# Patient Record
Sex: Female | Born: 1935 | Race: White | Hispanic: No | Smoking: Former smoker
Health system: Southern US, Community
[De-identification: ages and names within clinical notes are randomized; demographics above are authoritative.]

## PROBLEM LIST (undated history)

## (undated) DIAGNOSIS — M810 Age-related osteoporosis without current pathological fracture: Secondary | ICD-10-CM

## (undated) DIAGNOSIS — R918 Other nonspecific abnormal finding of lung field: Secondary | ICD-10-CM

## (undated) DIAGNOSIS — R0902 Hypoxemia: Secondary | ICD-10-CM

## (undated) DIAGNOSIS — C3410 Malignant neoplasm of upper lobe, unspecified bronchus or lung: Secondary | ICD-10-CM

## (undated) DIAGNOSIS — J449 Chronic obstructive pulmonary disease, unspecified: Secondary | ICD-10-CM

## (undated) DIAGNOSIS — D649 Anemia, unspecified: Secondary | ICD-10-CM

## (undated) DIAGNOSIS — Z9289 Personal history of other medical treatment: Secondary | ICD-10-CM

## (undated) HISTORY — DX: Age-related osteoporosis without current pathological fracture: M81.0

## (undated) HISTORY — DX: Hypoxemia: R09.02

## (undated) HISTORY — DX: Anemia, unspecified: D64.9

## (undated) HISTORY — DX: Chronic obstructive pulmonary disease, unspecified: J44.9

## (undated) HISTORY — PX: TONSILLECTOMY: SUR1361

## (undated) HISTORY — DX: Other nonspecific abnormal finding of lung field: R91.8

## (undated) HISTORY — PX: CATARACT EXTRACTION W/ INTRAOCULAR LENS  IMPLANT, BILATERAL: SHX1307

---

## 1994-10-20 HISTORY — PX: CHOLECYSTECTOMY: SHX55

## 2014-02-17 DIAGNOSIS — Z9289 Personal history of other medical treatment: Secondary | ICD-10-CM

## 2014-02-17 HISTORY — DX: Personal history of other medical treatment: Z92.89

## 2014-02-28 ENCOUNTER — Ambulatory Visit (INDEPENDENT_AMBULATORY_CARE_PROVIDER_SITE_OTHER): Payer: Medicare Other | Admitting: Family Medicine

## 2014-02-28 ENCOUNTER — Encounter: Payer: Self-pay | Admitting: Family Medicine

## 2014-02-28 ENCOUNTER — Encounter (HOSPITAL_COMMUNITY): Payer: Self-pay | Admitting: Emergency Medicine

## 2014-02-28 ENCOUNTER — Emergency Department (HOSPITAL_COMMUNITY): Payer: Medicare Other

## 2014-02-28 ENCOUNTER — Inpatient Hospital Stay (HOSPITAL_COMMUNITY)
Admission: EM | Admit: 2014-02-28 | Discharge: 2014-03-08 | DRG: 469 | Disposition: A | Discharge status: Skilled Nursing Facility | Payer: Medicare Other | Attending: Internal Medicine | Admitting: Internal Medicine

## 2014-02-28 VITALS — BP 140/90 | HR 80 | Resp 16

## 2014-02-28 DIAGNOSIS — S72001A Fracture of unspecified part of neck of right femur, initial encounter for closed fracture: Secondary | ICD-10-CM | POA: Diagnosis present

## 2014-02-28 DIAGNOSIS — R590 Localized enlarged lymph nodes: Secondary | ICD-10-CM

## 2014-02-28 DIAGNOSIS — D62 Acute posthemorrhagic anemia: Secondary | ICD-10-CM | POA: Diagnosis not present

## 2014-02-28 DIAGNOSIS — Z791 Long term (current) use of non-steroidal anti-inflammatories (NSAID): Secondary | ICD-10-CM

## 2014-02-28 DIAGNOSIS — M25559 Pain in unspecified hip: Secondary | ICD-10-CM

## 2014-02-28 DIAGNOSIS — C3490 Malignant neoplasm of unspecified part of unspecified bronchus or lung: Secondary | ICD-10-CM | POA: Diagnosis present

## 2014-02-28 DIAGNOSIS — M25551 Pain in right hip: Secondary | ICD-10-CM

## 2014-02-28 DIAGNOSIS — I959 Hypotension, unspecified: Secondary | ICD-10-CM

## 2014-02-28 DIAGNOSIS — W19XXXA Unspecified fall, initial encounter: Secondary | ICD-10-CM

## 2014-02-28 DIAGNOSIS — I509 Heart failure, unspecified: Secondary | ICD-10-CM | POA: Diagnosis present

## 2014-02-28 DIAGNOSIS — Z87891 Personal history of nicotine dependence: Secondary | ICD-10-CM

## 2014-02-28 DIAGNOSIS — R339 Retention of urine, unspecified: Secondary | ICD-10-CM | POA: Diagnosis present

## 2014-02-28 DIAGNOSIS — D696 Thrombocytopenia, unspecified: Secondary | ICD-10-CM | POA: Diagnosis not present

## 2014-02-28 DIAGNOSIS — N189 Chronic kidney disease, unspecified: Secondary | ICD-10-CM

## 2014-02-28 DIAGNOSIS — M810 Age-related osteoporosis without current pathological fracture: Secondary | ICD-10-CM | POA: Diagnosis present

## 2014-02-28 DIAGNOSIS — J449 Chronic obstructive pulmonary disease, unspecified: Secondary | ICD-10-CM | POA: Diagnosis present

## 2014-02-28 DIAGNOSIS — R918 Other nonspecific abnormal finding of lung field: Secondary | ICD-10-CM

## 2014-02-28 DIAGNOSIS — J4489 Other specified chronic obstructive pulmonary disease: Secondary | ICD-10-CM | POA: Diagnosis present

## 2014-02-28 DIAGNOSIS — R55 Syncope and collapse: Secondary | ICD-10-CM

## 2014-02-28 DIAGNOSIS — J90 Pleural effusion, not elsewhere classified: Secondary | ICD-10-CM | POA: Diagnosis not present

## 2014-02-28 DIAGNOSIS — N179 Acute kidney failure, unspecified: Secondary | ICD-10-CM | POA: Diagnosis not present

## 2014-02-28 DIAGNOSIS — R338 Other retention of urine: Secondary | ICD-10-CM | POA: Diagnosis not present

## 2014-02-28 DIAGNOSIS — D649 Anemia, unspecified: Secondary | ICD-10-CM | POA: Diagnosis present

## 2014-02-28 DIAGNOSIS — R222 Localized swelling, mass and lump, trunk: Secondary | ICD-10-CM | POA: Diagnosis present

## 2014-02-28 DIAGNOSIS — R7309 Other abnormal glucose: Secondary | ICD-10-CM | POA: Diagnosis present

## 2014-02-28 DIAGNOSIS — N3090 Cystitis, unspecified without hematuria: Secondary | ICD-10-CM | POA: Diagnosis not present

## 2014-02-28 DIAGNOSIS — I5033 Acute on chronic diastolic (congestive) heart failure: Secondary | ICD-10-CM | POA: Diagnosis present

## 2014-02-28 DIAGNOSIS — Z880 Allergy status to penicillin: Secondary | ICD-10-CM

## 2014-02-28 DIAGNOSIS — M84453A Pathological fracture, unspecified femur, initial encounter for fracture: Principal | ICD-10-CM | POA: Diagnosis present

## 2014-02-28 DIAGNOSIS — E860 Dehydration: Secondary | ICD-10-CM | POA: Diagnosis present

## 2014-02-28 LAB — URINE MICROSCOPIC-ADD ON

## 2014-02-28 LAB — URINALYSIS, ROUTINE W REFLEX MICROSCOPIC
Bilirubin Urine: NEGATIVE
Glucose, UA: NEGATIVE mg/dL
Hgb urine dipstick: NEGATIVE
Ketones, ur: 15 mg/dL — AB
NITRITE: NEGATIVE
Specific Gravity, Urine: 1.042 — ABNORMAL HIGH (ref 1.005–1.030)
Urobilinogen, UA: 1 mg/dL (ref 0.0–1.0)
pH: 6.5 (ref 5.0–8.0)

## 2014-02-28 LAB — CBC WITH DIFFERENTIAL/PLATELET
Basophils Absolute: 0 10*3/uL (ref 0.0–0.1)
Basophils Relative: 0 % (ref 0–1)
EOS PCT: 0 % (ref 0–5)
Eosinophils Absolute: 0 10*3/uL (ref 0.0–0.7)
HEMATOCRIT: 41.3 % (ref 36.0–46.0)
HEMOGLOBIN: 13.8 g/dL (ref 12.0–15.0)
Lymphocytes Relative: 5 % — ABNORMAL LOW (ref 12–46)
Lymphs Abs: 0.7 10*3/uL (ref 0.7–4.0)
MCH: 31 pg (ref 26.0–34.0)
MCHC: 33.4 g/dL (ref 30.0–36.0)
MCV: 92.8 fL (ref 78.0–100.0)
MONOS PCT: 3 % (ref 3–12)
Monocytes Absolute: 0.5 10*3/uL (ref 0.1–1.0)
NEUTROS ABS: 13.4 10*3/uL — AB (ref 1.7–7.7)
Neutrophils Relative %: 92 % — ABNORMAL HIGH (ref 43–77)
Platelets: 161 10*3/uL (ref 150–400)
RBC: 4.45 MIL/uL (ref 3.87–5.11)
RDW: 13.9 % (ref 11.5–15.5)
WBC: 14.7 10*3/uL — ABNORMAL HIGH (ref 4.0–10.5)

## 2014-02-28 LAB — COMPREHENSIVE METABOLIC PANEL
ALT: 17 U/L (ref 0–35)
AST: 29 U/L (ref 0–37)
Albumin: 3.1 g/dL — ABNORMAL LOW (ref 3.5–5.2)
Alkaline Phosphatase: 83 U/L (ref 39–117)
BILIRUBIN TOTAL: 0.9 mg/dL (ref 0.3–1.2)
BUN: 20 mg/dL (ref 6–23)
CALCIUM: 9.2 mg/dL (ref 8.4–10.5)
CHLORIDE: 106 meq/L (ref 96–112)
CO2: 24 mEq/L (ref 19–32)
Creatinine, Ser: 0.78 mg/dL (ref 0.50–1.10)
GFR calc Af Amer: 88 mL/min — ABNORMAL LOW (ref >90)
GFR calc non Af Amer: 76 mL/min — ABNORMAL LOW (ref >90)
Glucose, Bld: 135 mg/dL — ABNORMAL HIGH (ref 70–99)
Potassium: 4.5 mEq/L (ref 3.7–5.3)
Sodium: 143 mEq/L (ref 137–147)
Total Protein: 6.3 g/dL (ref 6.0–8.3)

## 2014-02-28 LAB — TROPONIN I: Troponin I: <0.3 ng/mL (ref <0.30)

## 2014-02-28 MED ORDER — ADULT MULTIVITAMIN W/MINERALS CH
1.0000 | ORAL_TABLET | Freq: Every day | ORAL | Status: DC
  Administered 2014-02-28 – 2014-03-08 (×7): 1 via ORAL
  Filled 2014-02-28 (×9): qty 1

## 2014-02-28 MED ORDER — SODIUM CHLORIDE 0.9 % IJ SOLN: 3.0000 mL | INTRAMUSCULAR | Status: DC | PRN

## 2014-02-28 MED ORDER — CEFAZOLIN SODIUM-DEXTROSE 2-3 GM-% IV SOLR
2.0000 g | Freq: Once | INTRAVENOUS | Status: AC
  Administered 2014-02-28: 2 g via INTRAVENOUS
  Filled 2014-02-28: qty 50

## 2014-02-28 MED ORDER — DOCUSATE SODIUM 100 MG PO CAPS
100.0000 mg | ORAL_CAPSULE | Freq: Two times a day (BID) | ORAL | Status: DC | PRN
  Administered 2014-03-04: 100 mg via ORAL
  Filled 2014-02-28: qty 1

## 2014-02-28 MED ORDER — SENNA 8.6 MG PO TABS
1.0000 | ORAL_TABLET | Freq: Every day | ORAL | Status: DC | PRN
  Filled 2014-02-28 (×3): qty 1

## 2014-02-28 MED ORDER — SODIUM CHLORIDE 0.9 % IV SOLN
INTRAVENOUS | Status: AC
  Administered 2014-02-28: 20:00:00 via INTRAVENOUS

## 2014-02-28 MED ORDER — SODIUM CHLORIDE 0.9 % IJ SOLN
3.0000 mL | Freq: Two times a day (BID) | INTRAMUSCULAR | Status: DC
  Administered 2014-02-28: 3 mL via INTRAVENOUS
  Administered 2014-03-02: 11:00:00 via INTRAVENOUS
  Administered 2014-03-04 – 2014-03-07 (×6): 3 mL via INTRAVENOUS

## 2014-02-28 MED ORDER — ONDANSETRON HCL 4 MG/2ML IJ SOLN
4.0000 mg | Freq: Once | INTRAMUSCULAR | Status: AC
  Administered 2014-02-28: 4 mg via INTRAVENOUS
  Filled 2014-02-28: qty 2

## 2014-02-28 MED ORDER — SODIUM CHLORIDE 0.9 % IV BOLUS (SEPSIS)
500.0000 mL | Freq: Once | INTRAVENOUS | Status: AC
  Administered 2014-02-28: 500 mL via INTRAVENOUS

## 2014-02-28 MED ORDER — TRAMADOL HCL 50 MG PO TABS
50.0000 mg | ORAL_TABLET | Freq: Once | ORAL | Status: AC
  Administered 2014-02-28: 50 mg via ORAL
  Filled 2014-02-28: qty 1

## 2014-02-28 MED ORDER — ENOXAPARIN SODIUM 40 MG/0.4ML ~~LOC~~ SOLN
40.0000 mg | SUBCUTANEOUS | Status: DC
  Administered 2014-02-28: 40 mg via SUBCUTANEOUS
  Filled 2014-02-28: qty 0.4

## 2014-02-28 MED ORDER — FENTANYL CITRATE 0.05 MG/ML IJ SOLN
50.0000 ug | Freq: Once | INTRAMUSCULAR | Status: AC
  Administered 2014-02-28: 50 ug via INTRAVENOUS
  Filled 2014-02-28: qty 2

## 2014-02-28 MED ORDER — HYDROCODONE-ACETAMINOPHEN 5-325 MG PO TABS
1.0000 | ORAL_TABLET | ORAL | Status: DC | PRN
  Administered 2014-02-28 – 2014-03-01 (×2): 1 via ORAL
  Administered 2014-03-02 – 2014-03-06 (×4): 2 via ORAL
  Filled 2014-02-28: qty 2
  Filled 2014-02-28: qty 1
  Filled 2014-02-28 (×5): qty 2

## 2014-02-28 MED ORDER — SODIUM CHLORIDE 0.9 % IJ SOLN
3.0000 mL | Freq: Two times a day (BID) | INTRAMUSCULAR | Status: DC
  Administered 2014-02-28 – 2014-03-08 (×3): 3 mL via INTRAVENOUS

## 2014-02-28 MED ORDER — PROMETHAZINE HCL 25 MG PO TABS: 12.5000 mg | ORAL_TABLET | ORAL | Status: DC | PRN

## 2014-02-28 NOTE — Consult Note (Signed)
Reason for Consult:hip fracture  Referring Physician: hospitalist  Michele Johns is an 80 y.o. female.  HPI: Pt with several weeks of right hip pain.  No injury.  Reports pain has been radiating into the right groin causing her to limp.  On Sunday the pain became very severe and she could not tolerate weight bearing. She decided she would get it checked on Monday.  Pt presented to the Urgent Care where she had a syncopal episode and was transferred to the ED for evaluation.  Radiographs show nondisplaced femoral neck fracture.  Pt very good health and lives alone.  Independent with all activities.  She still drives and does her own grocery shopping.  No primary care doctor.  Takes only vitamins.  Previous surgery for cholecystectomy.   Daughter present during interview.  Family close and will help care for her.  Discussed pinning of hip versus anterior total hip replacement.  Pt wishes to remain as independent as possible but will consider short term facility for rehab.  Risk and benefits of both procedures discussed with pt an daughter.  They will discuss options and make final decision with Dr Yates when he comes later today.    Pt will be admitted by the Hospitalist service and cleared for surgery to be done by Dr Yates possibly tomorrow.  History reviewed. No pertinent past medical history.  History reviewed. No pertinent past surgical history.  No family history on file.  Social History:  reports that she has never smoked. She does not have any smokeless tobacco history on file. She reports that she does not drink alcohol or use illicit drugs.  Allergies:  Allergies  Allergen Reactions  . Penicillins Other (See Comments)    Reaction: blisters on leg    Medications: I have reviewed the patient's current medications.  Results for orders placed during the hospital encounter of 02/28/14 (from the past 48 hour(s))  CBC WITH DIFFERENTIAL     Status: Abnormal   Collection Time     05 /12/15  1:19 PM      Result Value Ref Range   WBC 14.7 (*) 4.0 - 10.5 K/uL   RBC 4.45  3.87 - 5.11 MIL/uL   Hemoglobin 13.8  12.0 - 15.0 g/dL   HCT 41.3  36.0 - 46.0 %   MCV 92.8  78.0 - 100.0 fL   MCH 31.0  26.0 - 34.0 pg   MCHC 33.4  30.0 - 36.0 g/dL   RDW 13.9  11.5 - 15.5 %   Platelets 161  150 - 400 K/uL   Neutrophils Relative % 92 (*) 43 - 77 %   Neutro Abs 13.4 (*) 1.7 - 7.7 K/uL   Lymphocytes Relative 5 (*) 12 - 46 %   Lymphs Abs 0.7  0.7 - 4.0 K/uL   Monocytes Relative 3  3 - 12 %   Monocytes Absolute 0.5  0.1 - 1.0 K/uL   Eosinophils Relative 0  0 - 5 %   Eosinophils Absolute 0.0  0.0 - 0.7 K/uL   Basophils Relative 0  0 - 1 %   Basophils Absolute 0.0  0.0 - 0.1 K/uL  COMPREHENSIVE METABOLIC PANEL     Status: Abnormal   Collection Time    02/28/14  1:19 PM      Result Value Ref Range   Sodium 143  137 - 147 mEq/L   Potassium 4.5  3.7 - 5.3 mEq/L   Chloride 106  96 - 112 mEq/L   CO2  24  19 - 32 mEq/L   Glucose, Bld 135 (*) 70 - 99 mg/dL   BUN 20  6 - 23 mg/dL   Creatinine, Ser 0.78  0.50 - 1.10 mg/dL   Calcium 9.2  8.4 - 10.5 mg/dL   Total Protein 6.3  6.0 - 8.3 g/dL   Albumin 3.1 (*) 3.5 - 5.2 g/dL   AST 29  0 - 37 U/L   ALT 17  0 - 35 U/L   Alkaline Phosphatase 83  39 - 117 U/L   Total Bilirubin 0.9  0.3 - 1.2 mg/dL   GFR calc non Af Amer 76 (*) >90 mL/min   GFR calc Af Amer 88 (*) >90 mL/min   Comment: (NOTE)     The eGFR has been calculated using the CKD EPI equation.     This calculation has not been validated in all clinical situations.     eGFR's persistently <90 mL/min signify possible Chronic Kidney     Disease.  TROPONIN I     Status: None   Collection Time    02/28/14  1:19 PM      Result Value Ref Range   Troponin I <0.30  <0.30 ng/mL   Comment:            Due to the release kinetics of cTnI,     a negative result within the first hours     of the onset of symptoms does not rule out     myocardial infarction with certainty.     If  myocardial infarction is still suspected,     repeat the test at appropriate intervals.  URINALYSIS, ROUTINE W REFLEX MICROSCOPIC     Status: Abnormal   Collection Time    02/28/14  2:01 PM      Result Value Ref Range   Color, Urine AMBER (*) YELLOW   Comment: BIOCHEMICALS MAY BE AFFECTED BY COLOR   APPearance HAZY (*) CLEAR   Specific Gravity, Urine 1.042 (*) 1.005 - 1.030   pH 6.5  5.0 - 8.0   Glucose, UA NEGATIVE  NEGATIVE mg/dL   Hgb urine dipstick NEGATIVE  NEGATIVE   Bilirubin Urine NEGATIVE  NEGATIVE   Ketones, ur 15 (*) NEGATIVE mg/dL   Protein, ur >300 (*) NEGATIVE mg/dL   Urobilinogen, UA 1.0  0.0 - 1.0 mg/dL   Nitrite NEGATIVE  NEGATIVE   Leukocytes, UA SMALL (*) NEGATIVE  URINE MICROSCOPIC-ADD ON     Status: Abnormal   Collection Time    02/28/14  2:01 PM      Result Value Ref Range   Squamous Epithelial / LPF FEW (*) RARE   WBC, UA 7-10  <3 WBC/hpf   Bacteria, UA MANY (*) RARE   Casts HYALINE CASTS (*) NEGATIVE   Comment: GRANULAR CAST   Crystals CA OXALATE CRYSTALS (*) NEGATIVE   Urine-Other LESS THAN 10 mL OF URINE SUBMITTED      Dg Chest 2 View  02/28/2014   CLINICAL DATA:  Syncope  EXAM: CHEST  2 VIEW  COMPARISON:  None.  FINDINGS: The cardiac and mediastinal silhouettes are within normal limits. Atherosclerotic calcifications noted within the aortic arch.  The lungs are mildly hyperinflated with attenuation of the pulmonary markings, compatible with COPD. Marland Kitchen No airspace consolidation, pleural effusion, or pulmonary edema is identified. There is no pneumothorax.  No acute osseous abnormality identified.  IMPRESSION: 1. No acute cardiopulmonary abnormality. 2. COPD.   Electronically Signed   By: Jeannine Boga  M.D.   On: 02/28/2014 14:23   Dg Hip Complete Right  02/28/2014   CLINICAL DATA:  Right anterior groin pain radiating distally. Limited range of motion.  EXAM: RIGHT HIP - COMPLETE 2+ VIEW  COMPARISON:  None available.  FINDINGS: There is an acute  nondisplaced fracture through the right femoral neck with slight superior subluxation. The femoral head remains normally aligned within the acetabulum. Femoral head height is preserved. Limited views of the left hip are unremarkable. No pubic dye stasis. SI joints are approximated. Bony pelvis is intact.  Diffuse osteopenia noted. Degenerative changes present within the lower lumbar spine.  IMPRESSION: Acute nondisplaced fracture through the right femoral neck.   Electronically Signed   By: Jeannine Boga M.D.   On: 02/28/2014 14:25    Review of Systems  Constitutional: Negative.   HENT: Negative.   Eyes: Negative.   Respiratory: Negative.  Negative for shortness of breath.   Cardiovascular: Negative.  Negative for chest pain and palpitations.  Gastrointestinal: Negative.   Genitourinary: Negative.   Musculoskeletal:       Severe right groin pain  Psychiatric/Behavioral: Negative.    Blood pressure 143/55, pulse 78, temperature 97.5 F (36.4 C), temperature source Oral, resp. rate 18, weight 45.36 kg (100 lb), SpO2 98.00%. Physical Exam  Constitutional: She is oriented to person, place, and time. She appears well-developed and well-nourished.  HENT:  Head: Normocephalic and atraumatic.  Eyes: Pupils are equal, round, and reactive to light.  Neck: Normal range of motion.  GI: Soft.  Musculoskeletal:  No leg length discrepancy.  Distal pulsed +1 bilateral.  No peripheral edema.  Does not tolerate motion of the right leg.  Ankle DF/PF intact.  Sensation intact distally.  Neurological: She is alert and oriented to person, place, and time.  Skin: Skin is warm and dry.  Psychiatric: She has a normal mood and affect.    Assessment/Plan: Right femoral neck fracture.  Discussed options of pinning vs anterior THR.  Final decision will by made when Dr Lorin Mercy sees pt later today.  Epimenio Foot 02/28/2014, 4:49 PM

## 2014-02-28 NOTE — ED Provider Notes (Signed)
CSN: [id number]     Arrival date & time    History   First MD Initiated Contact with Patient 02/28/14 1243     Chief Complaint  Patient presents with  . Hip Pain  . Loss of Consciousness     (Consider location/radiation/quality/duration/timing/severity/associated sxs/prior Treatment) Patient is a 80 y.o. female presenting with hip pain and syncope. The history is provided by the patient.  Hip Pain This is a new problem. The current episode started more than 1 week ago. The problem occurs constantly. The problem has been rapidly worsening. Pertinent negatives include no chest pain, no abdominal pain, no headaches and no shortness of breath. The symptoms are aggravated by walking. Nothing relieves the symptoms. She has tried acetaminophen for the symptoms. The treatment provided mild relief.  Loss of Consciousness Episode history:  Single Most recent episode:  Today Timing: once. Progression:  Resolved Chronicity:  New Context comment:  While walking Witnessed: yes   Relieved by:  Nothing Worsened by:  Nothing tried Ineffective treatments:  None tried Associated symptoms: no chest pain, no dizziness, no fever, no headaches, no nausea, no shortness of breath and no vomiting     History reviewed. No pertinent past medical history. History reviewed. No pertinent past surgical history. No family history on file. History  Substance Use Topics  . Smoking status: Never Smoker   . Smokeless tobacco: Not on file  . Alcohol Use: No   OB History   Grav Para Term Preterm Abortions TAB SAB Ect Mult Living                 Review of Systems  Constitutional: Negative for fever and fatigue.  HENT: Negative for congestion and drooling.   Eyes: Negative for pain.  Respiratory: Negative for cough and shortness of breath.   Cardiovascular: Positive for syncope. Negative for chest pain.  Gastrointestinal: Negative for nausea, vomiting, abdominal pain and diarrhea.  Genitourinary: Negative  for dysuria and hematuria.  Musculoskeletal: Negative for back pain, gait problem and neck pain.  Skin: Negative for color change.  Neurological: Negative for dizziness and headaches.  Hematological: Negative for adenopathy.  Psychiatric/Behavioral: Negative for behavioral problems.  All other systems reviewed and are negative.     Allergies  Penicillins  Home Medications   Prior to Admission medications   Medication Sig Start Date End Date Taking? Authorizing Provider  Multiple Vitamin (MULTIVITAMIN WITH MINERALS) TABS tablet Take 1 tablet by mouth daily.   Yes Historical Provider, MD  naproxen sodium (ANAPROX) 220 MG tablet Take 440 mg by mouth daily.   Yes Historical Provider, MD   BP 139/54  Temp(Src) 97.5 F (36.4 C) (Oral)  Resp 18  Wt 100 lb (45.36 kg)  SpO2 91% Physical Exam  Nursing note and vitals reviewed. Constitutional: She is oriented to person, place, and time. She appears well-developed and well-nourished.  HENT:  Head: Normocephalic.  Mouth/Throat: Oropharynx is clear and moist. No oropharyngeal exudate.  Eyes: Conjunctivae and EOM are normal. Pupils are equal, round, and reactive to light.  Neck: Normal range of motion. Neck supple.  Cardiovascular: Normal rate, regular rhythm, normal heart sounds and intact distal pulses.  Exam reveals no gallop and no friction rub.   No murmur heard. Pulmonary/Chest: Effort normal and breath sounds normal. No respiratory distress. She has no wheezes.  Abdominal: Soft. Bowel sounds are normal. There is no tenderness. There is no rebound and no guarding.  Musculoskeletal: Normal range of motion. She exhibits no edema and  no tenderness.  2+ distal pulses in the lower extremities.  Normal range of motion of the left hip without pain.  The patient has pain with extension and flexion of the right hip.  Otherwise normal appearing and symmetric lower extremities.  Neurological: She is alert and oriented to person, place, and  time.  Skin: Skin is warm and dry.  Psychiatric: She has a normal mood and affect. Her behavior is normal.    ED Course  Procedures (including critical care time) Labs Review Labs Reviewed  CBC WITH DIFFERENTIAL - Abnormal; Notable for the following:    WBC 14.7 (*)    Neutrophils Relative % 92 (*)    Neutro Abs 13.4 (*)    Lymphocytes Relative 5 (*)    All other components within normal limits  COMPREHENSIVE METABOLIC PANEL - Abnormal; Notable for the following:    Glucose, Bld 135 (*)    Albumin 3.1 (*)    GFR calc non Af Amer 76 (*)    GFR calc Af Amer 88 (*)    All other components within normal limits  URINALYSIS, ROUTINE W REFLEX MICROSCOPIC - Abnormal; Notable for the following:    Color, Urine AMBER (*)    APPearance HAZY (*)    Specific Gravity, Urine 1.042 (*)    Ketones, ur 15 (*)    Protein, ur >300 (*)    Leukocytes, UA SMALL (*)    All other components within normal limits  URINE MICROSCOPIC-ADD ON - Abnormal; Notable for the following:    Squamous Epithelial / LPF FEW (*)    Bacteria, UA MANY (*)    Casts HYALINE CASTS (*)    Crystals CA OXALATE CRYSTALS (*)    All other components within normal limits  URINE CULTURE  TROPONIN I  VITAMIN D 25 HYDROXY  CBC WITH DIFFERENTIAL  TROPONIN I  TROPONIN I  HEMOGLOBIN A1C    Imaging Review Dg Chest 2 View  02/28/2014   CLINICAL DATA:  Syncope  EXAM: CHEST  2 VIEW  COMPARISON:  None.  FINDINGS: The cardiac and mediastinal silhouettes are within normal limits. Atherosclerotic calcifications noted within the aortic arch.  The lungs are mildly hyperinflated with attenuation of the pulmonary markings, compatible with COPD. Marland Kitchen No airspace consolidation, pleural effusion, or pulmonary edema is identified. There is no pneumothorax.  No acute osseous abnormality identified.  IMPRESSION: 1. No acute cardiopulmonary abnormality. 2. COPD.   Electronically Signed   By: Jeannine Boga M.D.   On: 02/28/2014 14:23   Dg Hip  Complete Right  02/28/2014   CLINICAL DATA:  Right anterior groin pain radiating distally. Limited range of motion.  EXAM: RIGHT HIP - COMPLETE 2+ VIEW  COMPARISON:  None available.  FINDINGS: There is an acute nondisplaced fracture through the right femoral neck with slight superior subluxation. The femoral head remains normally aligned within the acetabulum. Femoral head height is preserved. Limited views of the left hip are unremarkable. No pubic dye stasis. SI joints are approximated. Bony pelvis is intact.  Diffuse osteopenia noted. Degenerative changes present within the lower lumbar spine.  IMPRESSION: Acute nondisplaced fracture through the right femoral neck.   Electronically Signed   By: Jeannine Boga M.D.   On: 02/28/2014 14:25     EKG Interpretation   Date/Time:  Tuesday Feb 28 2014 12:48:43 EDT Ventricular Rate:  94 PR Interval:  152 QRS Duration: 78 QT Interval:  402 QTC Calculation: 503 R Axis:   -76 Text Interpretation:  Age  not entered, assumed to be  80 years old for  purpose of ECG interpretation Sinus rhythm Right atrial enlargement Left  anterior fascicular block Abnormal R-wave progression, early transition  Nonspecific T abnrm, anterolateral leads Prolonged QT interval Confirmed  by Karyna Bessler  MD, Jenise Iannelli ([id number]) on 02/28/2014 2:45:44 PM      MDM   Final diagnoses:  Closed right hip fracture  Syncope    1:19 PM 80 y.o. female who presents with worsening right hip pain over the last few weeks. She notes acute worsening in last 2 days. She was walking into an urgent care this morning when she had a syncopal episode. She believes she was caught by her daughter and did not fall to the ground. She was having right hip pain at that time. She did not describe any prodrome. Initial blood pressure here was 80/40 but repeat blood pressures have been normal and I suspect this to be a spurious value. She is afebrile and vital signs are unremarkable on my exam. She denies  any fevers or other associated symptoms at home. Will get screening labwork and imaging.  Discussed case w/ Dr. Lorin Mercy (ortho). Will admit to medicine teaching service (unassigned)  Blanchard Kelch, MD 02/28/14 2010

## 2014-02-28 NOTE — Progress Notes (Signed)
   Subjective:  This chart was scribed for Robyn Haber, MD  by Stacy Gardner, [facility] Scribe. The patient was seen in room and the patient's care was started at 12:09 PM.  Patient ID: Michele Johns, female    DOB: 1930-01-14, 80 y.o.   MRN: [id number]  02/28/2014  Hip Pain, Cyst and Leg Pain   HPI HPI Comments: Michele Johns is a 80 y.o. female who [facility] complaining of right hip pain, probable swelling, and leg pain.   The patient states that she had pain in her right hip which was manageable relief over the last 7 days. Yesterday the pain got significantly worse. Daughter brought her mother to the office today where her mother collapsed in the parking lot. She was lifted to a wheelchair and was unresponsive for about 3 minutes. During that time she was moved to a bed and she became more responsive.  Patient had no chest pain, shortness of breath, abdominal pain, nausea vomiting or diarrhea. She has no history of syncope.   Review of Systems  No past medical history on file. Allergies not on file No current outpatient prescriptions on file.   No current facility-administered medications for this visit.       Objective:    BP 80/40  Pulse 80  Resp 16 Physical Exam No results found for this or any previous visit. As noted above, patient was unresponsive when I first examined her in the parking lot. She was quickly move be a wheelchair onto a bed at which time she awoke and became responsive again. She remembers passing out in the parking lot but does not remember anything until she was moved to the gurney on exam room #6.  Patient clearly has pain in her right hip whenever removed. Her blood pressure rapidly returned to normal and she was put on oxygen. An IV was attempted but the vein blew by the time the EMTs arrived.  Patient was alert and cooperative. She is moving her upper extremities normally and her left leg  as well. Her right hip was tender on palpation of the trochanter and she experienced significant pain with any movement of the right hip.  Chest is clear, neck is supple, heart is regular without murmur, blood pressure did come up to 140/90 by the time the EMT people arrived. Abdomen is soft and nontender Ribs are nontender and neck is nontender. There is no evidence for head trauma. Her extremities are without edema and she has good pedal pulses    Assessment & Plan:  , I suspect patient has suffered a fractured hip and right femoral neck although this needs to be verified with radiographic imaging.  The presyncopal or episode does not appear to be a cardiovascular event but instead made for resulted from extreme pain when patient was trying to get out of her car.  At any rate, patient needs imaging of the right hip and further observation.  Signed, Robyn Haber, MD

## 2014-02-28 NOTE — ED Notes (Signed)
Admitting Physicians at the bedside.

## 2014-02-28 NOTE — Progress Notes (Signed)
Patient ID: Michele Johns, female   DOB: 1930-03-19, 80 y.o.   MRN: [id number] Discussed with pt and family surgical   Options patient wants to proceed with THA since she would be limited with TDWB with  Hip pinning.  Consent ordered. Patient likely had a stress fracture of the femoral neck  Which displaced as she was getting out of car to go to the urgent care .  Was not able to walk and xrays showed femoral neck fracture.  Plan right THA tomorrow afternoon.   Clear liquid breakfast then NPO

## 2014-02-28 NOTE — H&P (Signed)
Date: 02/28/2014               Patient Name:  Michele Johns MRN: [id number]  DOB: 08-18-1930 Age / Sex: 80 y.o., female   PCP: No primary provider on file.         Medical Service: Internal Medicine Teaching Service         Attending Physician: Dr. Bartholomew Crews, MD    First Contact: Dr. Juluis Mire  Pager: [phone]  Second Contact: Dr. Karlyn Agee Pager: [phone]       After Hours (After 5p/  First Contact Pager: [phone]  weekends / holidays): Second Contact Pager: [phone]   Chief Complaint: Syncope and progressive R Hip pain and R leg pain  History of Present Illness: Michele Johns is an 80 yo female with an unremarkable history who presented to the ED after a syncopal episode at the local [facility]. She had just arrived at the urgent care due to progressive pain on her right hip which was manageable the previous 7 days until Sunday, May 10th, when her pain became significantly worse. When she arrived at the Urgent care and got out of the car, she told her daughter that she "felt weak and needed help sitting down".  Her daughter, Michele Johns, reports that she was unconscious for less than 3 minutes before waking up without any confusion or lapse of memory before the episode. She remembers passing out in the parking lot but did not have any recollection of what happened between then and waking up inside of the exam room in the urgent care. While unconscious, her vitals were measured by the Urgent care employees and read at BP 80/40 , HR 80 and RR 16. An IV was attempted but the vein blew by the time the EMTs arrived. Patient denies any previous syncope, hypoglycemic episodes, weakness, altered mental status, SOB, or chest pain.  In regards to the hip and leg pain, the patient reports excruciating pain in any attempts of movement of her right leg. She has had this constant pain on her right upper leg for 2 weeks but has been getting progressively worse. Her pain was  alleviated by Alleve at the first but became progressively less responsive.  She localizes the pain on the lateral right hip and does not believe that she is currently weight baring. Her pain radiates down her leg when movement is attempted but was unwilling to move her leg during exam. Patient denies any trauma, weight loss, night sweats, fever, chills, dysuria, or urinary frequency.  Meds: No current facility-administered medications for this encounter.   Current Outpatient Prescriptions  Medication Sig Dispense Refill  . Multiple Vitamin (MULTIVITAMIN WITH MINERALS) TABS tablet Take 1 tablet by mouth daily.      . naproxen sodium (ANAPROX) 220 MG tablet Take 440 mg by mouth daily.        Allergies: Allergies as of 02/28/2014 - Review Complete 02/28/2014  Allergen Reaction Noted  . Penicillins Other (See Comments) 02/28/2014   History reviewed. No pertinent past medical history. History reviewed. No pertinent past surgical history. No family history on file. History   Social History  . Marital Status: Married    Spouse Name: N/A    Number of Children: N/A  . Years of Education: N/A   Occupational History  . Not on file.   Social History Main Topics  . Smoking status: Never Smoker   . Smokeless tobacco: Not on file  . Alcohol Use:  No  . Drug Use: No  . Sexual Activity: Not on file   Other Topics Concern  . Not on file   Social History Narrative  . No narrative on file    Review of Systems: Pertinent items are noted in HPI.  Physical Exam: Blood pressure 143/55, pulse 78, temperature 97.5 F (36.4 C), temperature source Oral, resp. rate 18, weight 45.36 kg (100 lb), SpO2 98.00%. BP 143/55  Pulse 78  Temp(Src) 97.5 F (36.4 C) (Oral)  Resp 18  Wt 45.36 kg (100 lb)  SpO2 98%  General Appearance:    Alert, cooperative, no distress, appears stated age  Head:    Normocephalic, without obvious abnormality, atraumatic  Eyes:    PERRL, conjunctiva/corneas clear,  EOM's intact  Nose:   Nares normal, septum midline, mucosa normal, no drainage    or sinus tenderness  Throat:   Lips, mucosa, and tongue normal; teeth and gums normal  Neck:   Supple, symmetrical, trachea midline, no adenopathy;    thyroid:  no enlargement/tenderness/nodules; no carotid   bruit or JVD  Lungs:     Clear to auscultation bilaterally, respirations unlabored. Decreased breath sounds  Chest Wall:    No tenderness or deformity   Heart:    Regular rate and rhythm, S1 and S2 normal, no murmur, rub   or gallop  Abdomen:     Soft, non-tender, bowel sounds active all four quadrants,    no masses, no organomegaly  Musculoskeletal:   Tenderness to light palpation on R hip and upper R leg. Unable to assess movement due to pain elicited on R leg.  Pulses:   2+ and symmetric all extremities  Skin:   Skin color, texture, turgor normal, no rashes or lesions  Lymph nodes:   Cervical, supraclavicular, and axillary nodes normal  Neurologic:   CNII-XII intact, normal strength, sensation and reflexes    throughout    Lab results:  CBC    Component Value Date/Time   WBC 14.7* 02/28/2014 1319   RBC 4.45 02/28/2014 1319   HGB 13.8 02/28/2014 1319   HCT 41.3 02/28/2014 1319   PLT 161 02/28/2014 1319   MCV 92.8 02/28/2014 1319   MCH 31.0 02/28/2014 1319   MCHC 33.4 02/28/2014 1319   RDW 13.9 02/28/2014 1319   LYMPHSABS 0.7 02/28/2014 1319   MONOABS 0.5 02/28/2014 1319   EOSABS 0.0 02/28/2014 1319   BASOSABS 0.0 02/28/2014 1319   Troponin (Point of Care Test) Cardiac Panel (last 3 results)  Recent Labs  02/28/14 1319  TROPONINI <4.16   basic metabolic panel Results for LASHANDRA, ARAUZ (MRN [id number]) as of 02/28/2014 17:25  Ref. Range 02/28/2014 12:48 02/28/2014 12:48 02/28/2014 13:19 02/28/2014 14:01 02/28/2014 14:15  Sodium Latest Range: 137-147 mEq/L   143    Potassium Latest Range: 3.7-5.3 mEq/L   4.5    Chloride Latest Range: 96-112 mEq/L   106    CO2 Latest Range: 19-32 mEq/L   24    BUN  Latest Range: 6-23 mg/dL   20    Creatinine Latest Range: 0.50-1.10 mg/dL   0.78    Calcium Latest Range: 8.4-10.5 mg/dL   9.2    GFR calc non Af Amer Latest Range: >90 mL/min   76 (L)    GFR calc Af Amer Latest Range: >90 mL/min   88 (L)    Glucose Latest Range: 70-99 mg/dL   135 (H)    Alkaline Phosphatase Latest Range: 39-117 U/L   83  Albumin Latest Range: 3.5-5.2 g/dL   3.1 (L)    AST Latest Range: 0-37 U/L   29    ALT Latest Range: 0-35 U/L   17    Total Protein Latest Range: 6.0-8.3 g/dL   6.3    Total Bilirubin Latest Range: 0.3-1.2 mg/dL   0.9      Urinalysis    Component Value Date/Time   COLORURINE AMBER* 02/28/2014 1401   APPEARANCEUR HAZY* 02/28/2014 1401   LABSPEC 1.042* 02/28/2014 1401   PHURINE 6.5 02/28/2014 1401   GLUCOSEU NEGATIVE 02/28/2014 1401   HGBUR NEGATIVE 02/28/2014 1401   BILIRUBINUR NEGATIVE 02/28/2014 1401   KETONESUR 15* 02/28/2014 1401   PROTEINUR >300* 02/28/2014 1401   UROBILINOGEN 1.0 02/28/2014 1401   NITRITE NEGATIVE 02/28/2014 1401   LEUKOCYTESUR SMALL* 02/28/2014 1401       Imaging results:  Dg Chest 2 View  02/28/2014   CLINICAL DATA:  Syncope  EXAM: CHEST  2 VIEW  COMPARISON:  None.  FINDINGS: The cardiac and mediastinal silhouettes are within normal limits. Atherosclerotic calcifications noted within the aortic arch.  The lungs are mildly hyperinflated with attenuation of the pulmonary markings, compatible with COPD. Marland Kitchen No airspace consolidation, pleural effusion, or pulmonary edema is identified. There is no pneumothorax.  No acute osseous abnormality identified.  IMPRESSION: 1. No acute cardiopulmonary abnormality. 2. COPD.   Electronically Signed   By: Jeannine Boga M.D.   On: 02/28/2014 14:23   Dg Hip Complete Right  02/28/2014   CLINICAL DATA:  Right anterior groin pain radiating distally. Limited range of motion.  EXAM: RIGHT HIP - COMPLETE 2+ VIEW  COMPARISON:  None available.  FINDINGS: There is an acute nondisplaced fracture  through the right femoral neck with slight superior subluxation. The femoral head remains normally aligned within the acetabulum. Femoral head height is preserved. Limited views of the left hip are unremarkable. No pubic dye stasis. SI joints are approximated. Bony pelvis is intact.  Diffuse osteopenia noted. Degenerative changes present within the lower lumbar spine.  IMPRESSION: Acute nondisplaced fracture through the right femoral neck.   Electronically Signed   By: Jeannine Boga M.D.   On: 02/28/2014 14:25    Other results: EKG: normal sinus rhythm, prolonged QT interval, Right trial enlargement, left anterior fascicular block, abnormal R wave progression, early transition, nonspecific T abnormal, anterolateral leads, prolonged QT interval..  Assessment & Plan by Problem: Mrs. Hibner is an 80 yo female with an unremarkable history who presented to the ED after a syncopal episode 2/2 to acute nondisplaced fracture through the right femoral neck. Probable causes of syncope in this patient include orthostatic hypotension, vasovagal episode, heart block, acute MI, infection, hypovolemia and seizures with vasovagal and orthostatic hypotension being the most probable cause. Cardiac etiology unllikely given no SOB/CP/palpitations, negative troponin, and no ischemic changes on 12-lead EKG. No neurological deficits, seizure activity, or post-ictal activity.  No new medications or electrolytes  abnormalities.   # Acute nondisplaced fracture through the right femoral neck - On XR right hip on 5/12. Pt with 2 week history of right hip/leg pain with no falls, injuries, or trauma. Pt with no prior BMD and calcium levels normal. Etiology most likely due to undiagnosed osteoporosis due to advanced age (80 yo F).  - Orthopedics consulted. Verbal confirmation for ORIF tomorrow . - Follow orthopedic recommendation - Norco/vicodin 1-2 Q 4hr pain  -Obtain Vit 25 OH levels  -Consider calcium/vitmain D  supplementation  -PT/OT once able to tolerate  -  Consider SNF   # Syncope - Syncopal episode most likely caused by the pain from her fracture - Once able to ambulate, will check orthostatics - Cycle troponins  -Monitor on telemetry  -Frequent neuro checks  -NS 75 mL/hr   # Osteopenia - Follow up with DEXA scan in the outpatient setting  # Asymptomatic cystitis - Pt with UA on 5/12 with small LE, pyuria (7-10), and bacteruria (many). Also with calcium oxalate crystals. WBC elevated at 14.7K with neutrophilia (92%).  -Monitor for symptoms  -No antibiotic therapy indicated at this time  -F/U urinary cultures  # Hyperglycemia - Glucose of 135 on CMP with no history of DM -Obtain A1c  # DVT prophylaxis - Loxenox 40mg  daily  Code: Full  DVT Ppx: Lovenox daily  Diet: Regular, NPO after midnight   Dispo: Disposition is deferred at this time, awaiting improvement of current medical problems. Anticipated discharge in approximately 5-7 day(s).   The patient does not have a current PCP (No primary provider on file.) and does need an [facility] hospital follow-up appointment after discharge.  The patient does have transportation limitations that hinder transportation to clinic appointments.  Signed: Judithe Modest, Med Student 02/28/2014, 4:41 PM

## 2014-02-28 NOTE — ED Notes (Signed)
Provider at the bedside.  

## 2014-02-28 NOTE — ED Notes (Signed)
Per EMS- Pt was at [facility] c/o right hip pain for 1 week; today when she was getting out of car at [facility] stood up and felt a pop, then had syncopal episode. Was taken inside [facility], doctors reports pt had agonal respirations, BP 80/60. Pt came around. Was given 100 mcg fentanyl, 4 mg zofran. Is currently diaphoretic, BP 142/71, 92% RA, placed on 2 L 98%. CBG 132. EKG SR. Pt is a x 4.

## 2014-02-28 NOTE — H&P (Signed)
Date: 02/28/2014               Patient Name:  Michele Johns MRN: [id number]  DOB: Sep 27, 1930 Age / Sex: 80 y.o., female   PCP: No primary provider on file.         Medical Service: Internal Medicine Teaching Service         Attending Physician: Dr. Bartholomew Crews, MD    First Contact: Dr. Naaman Plummer Pager: [phone]  Second Contact: Dr. Aundra Dubin Pager: [phone]       After Hours (After 5p/  First Contact Pager: [phone]  weekends / holidays): Second Contact Pager: [phone]   Chief Complaint: right leg/hip pain and LOC  History of Present Illness:   Michele Johns is a 80 year old female with no significant past medical history who presents with right leg/hip pain and episode of LOC that occurred today.   Pt reports that she was in her normal state of health until 2 weeks ago when she began having right leg/hip pain with no falls, injuries, or trauma that was at first relieved with Alieve but later worsened 2 days ago on Mother's Day.  She was able to walk at that time. Pt went to [facility] today around noon where she all of a sudden started feeling weak and then passed out while walking to the facility. She does not remember the exact details, but per daughter was out for about 3 minutes with no reported head injury, seizure activity, tongue biting, or bowel/urinary incontinence. She then awakened without confusion but found to be in significant pain with hypotension (low 80/60) that improved shortly thereafter with rest and oxygen therapy (92% on RA, requiring 2 L O2). She reports she was having good PO intake at home with normal urine output (without urinary symptoms). She denies lightheadedness, dizziness, chest pain, dyspnea, palpitations, focal weakness, paraesthesias, HA, or vision change.   Pt has no prior history of falls, syncope, DVT, or fractures. She has never had BMD scan before and takes multivitamins.   On ED admission pt had BP of 139/54 with normal SpO2 on RA.  Currently she reports persistent right hip pain and unable to move her right leg, stand, or walk.    Meds: No current facility-administered medications for this encounter.   Current Outpatient Prescriptions  Medication Sig Dispense Refill  . Multiple Vitamin (MULTIVITAMIN WITH MINERALS) TABS tablet Take 1 tablet by mouth daily.      . naproxen sodium (ANAPROX) 220 MG tablet Take 440 mg by mouth daily.        Allergies: Allergies as of 02/28/2014 - Review Complete 02/28/2014  Allergen Reaction Noted  . Penicillins Other (See Comments) 02/28/2014   History reviewed. No pertinent past medical history. History reviewed. No pertinent past surgical history. No family history on file. History   Social History  . Marital Status: Married    Spouse Name: N/A    Number of Children: N/A  . Years of Education: N/A   Occupational History  . Not on file.   Social History Main Topics  . Smoking status: Never Smoker   . Smokeless tobacco: Not on file  . Alcohol Use: No  . Drug Use: No  . Sexual Activity: Not on file   Other Topics Concern  . Not on file   Social History Narrative  . No narrative on file    Review of Systems: Review of Systems  Constitutional: Negative for fever, chills, weight loss, malaise/fatigue and diaphoresis.  HENT: Negative for congestion and sore throat.   Eyes: Negative for blurred vision.  Respiratory: Negative for cough, sputum production, shortness of breath and wheezing.   Cardiovascular: Negative for chest pain, palpitations and leg swelling.  Gastrointestinal: Negative for nausea, vomiting, abdominal pain, diarrhea and constipation.  Musculoskeletal: Positive for joint pain (right hip). Negative for falls and myalgias.  Skin: Negative for rash.  Neurological: Positive for loss of consciousness and weakness. Negative for dizziness, sensory change, focal weakness, seizures and headaches.  Psychiatric/Behavioral: Negative for depression.     Physical Exam: Blood pressure 143/55, pulse 78, temperature 97.5 F (36.4 C), temperature source Oral, resp. rate 18, weight 45.36 kg (100 lb), SpO2 98.00%. Physical Exam  Constitutional: She is oriented to person, place, and time. No distress.  Thin appearing  HENT:  Head: Normocephalic and atraumatic.  Right Ear: External ear normal.  Left Ear: External ear normal.  Nose: Nose normal.  Mouth/Throat: Oropharynx is clear and moist. No oropharyngeal exudate.  Eyes: Conjunctivae and EOM are normal. Right eye exhibits no discharge. Left eye exhibits no discharge. No scleral icterus.  Neck: Normal range of motion. Neck supple.  Cardiovascular: Normal rate, regular rhythm and normal heart sounds.   Pulmonary/Chest: Effort normal. No respiratory distress. She has no wheezes. She has no rales.  Decreased breath sounds  Abdominal: Soft. Bowel sounds are normal. She exhibits no distension. There is no tenderness. There is no rebound and no guarding.  Musculoskeletal: She exhibits tenderness (right groin and hip). She exhibits no edema.  Unable to tolerate right leg or hip movement  Neurological: She is alert and oriented to person, place, and time. No cranial nerve deficit.  Skin: Skin is warm and dry. No rash noted. She is not diaphoretic. No erythema. No pallor.  Psychiatric: She has a normal mood and affect. Her behavior is normal. Judgment and thought content normal.    Lab results: Basic Metabolic Panel:  Recent Labs  02/28/14 1319  NA 143  K 4.5  CL 106  CO2 24  GLUCOSE 135*  BUN 20  CREATININE 0.78  CALCIUM 9.2   Liver Function Tests:  Recent Labs  02/28/14 1319  AST 29  ALT 17  ALKPHOS 83  BILITOT 0.9  PROT 6.3  ALBUMIN 3.1*  CBC:  Recent Labs  02/28/14 1319  WBC 14.7*  NEUTROABS 13.4*  HGB 13.8  HCT 41.3  MCV 92.8  PLT 161   Cardiac Enzymes:  Recent Labs  02/28/14 1319  TROPONINI <0.30    Urinalysis:  Recent Labs  02/28/14 1401   COLORURINE AMBER*  LABSPEC 1.042*  PHURINE 6.5  GLUCOSEU NEGATIVE  HGBUR NEGATIVE  BILIRUBINUR NEGATIVE  KETONESUR 15*  PROTEINUR >300*  UROBILINOGEN 1.0  NITRITE NEGATIVE  LEUKOCYTESUR SMALL*    Imaging results:  Dg Chest 2 View  02/28/2014   CLINICAL DATA:  Syncope  EXAM: CHEST  2 VIEW  COMPARISON:  None.  FINDINGS: The cardiac and mediastinal silhouettes are within normal limits. Atherosclerotic calcifications noted within the aortic arch.  The lungs are mildly hyperinflated with attenuation of the pulmonary markings, compatible with COPD. Marland Kitchen No airspace consolidation, pleural effusion, or pulmonary edema is identified. There is no pneumothorax.  No acute osseous abnormality identified.  IMPRESSION: 1. No acute cardiopulmonary abnormality. 2. COPD.   Electronically Signed   By: Jeannine Boga M.D.   On: 02/28/2014 14:23   Dg Hip Complete Right  02/28/2014   CLINICAL DATA:  Right anterior groin pain radiating distally. Limited  range of motion.  EXAM: RIGHT HIP - COMPLETE 2+ VIEW  COMPARISON:  None available.  FINDINGS: There is an acute nondisplaced fracture through the right femoral neck with slight superior subluxation. The femoral head remains normally aligned within the acetabulum. Femoral head height is preserved. Limited views of the left hip are unremarkable. No pubic dye stasis. SI joints are approximated. Bony pelvis is intact.  Diffuse osteopenia noted. Degenerative changes present within the lower lumbar spine.  IMPRESSION: Acute nondisplaced fracture through the right femoral neck.   Electronically Signed   By: Jeannine Boga M.D.   On: 02/28/2014 14:25    Other results: EKG: None available  Assessment & Plan by Problem:  Syncope in setting of hypotension - Pt with 1st episode of syncope in setting of hypotension (80/60) most likely secondary to orthostatic hypotension in setting of severe pain and hypovolemia.  Cardiac etiology unllikely given no CP/palpitations,   negative troponin, and no ischemic changes on 12-lead EKG.  No neurological deficits to suggest neurological cause. No seizure activity witnessed. Vasovagal response also unlikely as pt was walking when episode occurred. No new medications or electrolytes abnormalities.  -Orthostatic labs if able to tolerate -Cycle troponins -Monitor on telemetry -Frequent neuro checks   -NS 75 mL/hr   Acute Nondisplaced Right Femoral Neck Fracture in setting of diffuse osteopenia  -  On XR right hip on 5/12. Pt with 2 week history of right hip/leg pain with no falls, injuries, or trauma. Pt with no prior BMD and calcium levels normal. Etiology most likely due to undiagnosed osteoporosis due to advanced (80 yo F).  -Appreciate orthopedic recommendations  -Pinning vs anterior THR possibly tomm 5/13 -Norco/vicodin 1-2 Q 4hr pain -Loxenox DVT prophylaxis  -Obtain Vit 25 OH levels  -Consider calcium/vitmain D supplementation  -PT/OT once able to tolerate  -Consider SNF   Asymptomatic Cystitis - Pt with UA on 5/12 with small LE, pyuria (7-10), and bacteruria (many). Also with calcium oxalate crystals. WBC elevated at 14.7K with neutrophilia (92%).  -Monitor for symptoms -No antibiotic therapy indicated at this time -F/U urinary cultures  Hyperglycemia - Glucose of 135 on CMP with no history of DM.  -Obtain A1c  Possible Undiagnosed COPD - Pt denies respiratory symptoms with no smoking history. CXR on 5/12 with lungs that are mildly hyperinflated with attenuation of the pulmonary markings, compatible with COPD. -Outpatient follow-up as needed -Albuterol PRN   Code: Full  DVT Ppx: Lovenox daily Diet: Regular, NPO after midnight    Dispo: Disposition is deferred at this time, awaiting improvement of current medical problems.    The patient does not have a current PCP (No primary provider on file.) and does need an [facility] hospital follow-up appointment after discharge.  The patient does not have  transportation limitations that hinder transportation to clinic appointments.  Signed: Juluis Mire, MD 02/28/2014, 4:41 PM

## 2014-03-01 ENCOUNTER — Encounter (HOSPITAL_COMMUNITY): Payer: Self-pay | Admitting: Anesthesiology

## 2014-03-01 ENCOUNTER — Inpatient Hospital Stay (HOSPITAL_COMMUNITY): Payer: Medicare Other | Admitting: Anesthesiology

## 2014-03-01 ENCOUNTER — Inpatient Hospital Stay (HOSPITAL_COMMUNITY): Payer: Medicare Other

## 2014-03-01 ENCOUNTER — Encounter (HOSPITAL_COMMUNITY): Payer: Medicare Other | Admitting: Anesthesiology

## 2014-03-01 ENCOUNTER — Encounter (HOSPITAL_COMMUNITY)
Admission: EM | Disposition: A | Discharge status: Skilled Nursing Facility | Payer: Self-pay | Source: Home / Self Care | Attending: Internal Medicine

## 2014-03-01 DIAGNOSIS — N189 Chronic kidney disease, unspecified: Secondary | ICD-10-CM

## 2014-03-01 DIAGNOSIS — N179 Acute kidney failure, unspecified: Secondary | ICD-10-CM | POA: Diagnosis present

## 2014-03-01 DIAGNOSIS — R7309 Other abnormal glucose: Secondary | ICD-10-CM

## 2014-03-01 DIAGNOSIS — R55 Syncope and collapse: Secondary | ICD-10-CM

## 2014-03-01 DIAGNOSIS — M949 Disorder of cartilage, unspecified: Secondary | ICD-10-CM

## 2014-03-01 DIAGNOSIS — N3090 Cystitis, unspecified without hematuria: Secondary | ICD-10-CM

## 2014-03-01 DIAGNOSIS — S72009A Fracture of unspecified part of neck of unspecified femur, initial encounter for closed fracture: Secondary | ICD-10-CM

## 2014-03-01 DIAGNOSIS — X58XXXA Exposure to other specified factors, initial encounter: Secondary | ICD-10-CM

## 2014-03-01 DIAGNOSIS — M899 Disorder of bone, unspecified: Secondary | ICD-10-CM

## 2014-03-01 HISTORY — PX: TOTAL HIP ARTHROPLASTY: SHX124

## 2014-03-01 LAB — CBC WITH DIFFERENTIAL/PLATELET
BASOS PCT: 0 % (ref 0–1)
Basophils Absolute: 0 10*3/uL (ref 0.0–0.1)
Eosinophils Absolute: 0 10*3/uL (ref 0.0–0.7)
Eosinophils Relative: 0 % (ref 0–5)
HEMATOCRIT: 39.4 % (ref 36.0–46.0)
HEMOGLOBIN: 13.1 g/dL (ref 12.0–15.0)
Lymphocytes Relative: 10 % — ABNORMAL LOW (ref 12–46)
Lymphs Abs: 1 10*3/uL (ref 0.7–4.0)
MCH: 30.8 pg (ref 26.0–34.0)
MCHC: 33.2 g/dL (ref 30.0–36.0)
MCV: 92.5 fL (ref 78.0–100.0)
MONO ABS: 1 10*3/uL (ref 0.1–1.0)
Monocytes Relative: 10 % (ref 3–12)
NEUTROS ABS: 7.9 10*3/uL — AB (ref 1.7–7.7)
Neutrophils Relative %: 80 % — ABNORMAL HIGH (ref 43–77)
Platelets: 130 10*3/uL — ABNORMAL LOW (ref 150–400)
RBC: 4.26 MIL/uL (ref 3.87–5.11)
RDW: 14.1 % (ref 11.5–15.5)
WBC: 9.9 10*3/uL (ref 4.0–10.5)

## 2014-03-01 LAB — TROPONIN I: Troponin I: <0.3 ng/mL (ref <0.30)

## 2014-03-01 LAB — HEMOGLOBIN A1C
Hgb A1c MFr Bld: 5.6 % (ref <5.7)
Mean Plasma Glucose: 114 mg/dL (ref <117)

## 2014-03-01 LAB — URINE CULTURE
COLONY COUNT: NO GROWTH
CULTURE: NO GROWTH

## 2014-03-01 SURGERY — ARTHROPLASTY, HIP, TOTAL, ANTERIOR APPROACH
Anesthesia: General | Site: Hip | Laterality: Right

## 2014-03-01 MED ORDER — MORPHINE SULFATE 2 MG/ML IJ SOLN: 1.0000 mg | INTRAMUSCULAR | Status: DC | PRN

## 2014-03-01 MED ORDER — ASPIRIN EC 325 MG PO TBEC
325.0000 mg | DELAYED_RELEASE_TABLET | Freq: Every day | ORAL | Status: DC
  Administered 2014-03-02 – 2014-03-08 (×6): 325 mg via ORAL
  Filled 2014-03-01 (×9): qty 1

## 2014-03-01 MED ORDER — ACETAMINOPHEN 650 MG RE SUPP: 650.0000 mg | Freq: Four times a day (QID) | RECTAL | Status: DC | PRN

## 2014-03-01 MED ORDER — METOCLOPRAMIDE HCL 5 MG PO TABS
5.0000 mg | ORAL_TABLET | Freq: Three times a day (TID) | ORAL | Status: DC | PRN
Start: 2014-03-01 — End: 2014-03-02
  Filled 2014-03-01: qty 2

## 2014-03-01 MED ORDER — LACTATED RINGERS IV SOLN
INTRAVENOUS | Status: DC | PRN
  Administered 2014-03-01 (×2): via INTRAVENOUS

## 2014-03-01 MED ORDER — FENTANYL CITRATE 0.05 MG/ML IJ SOLN
INTRAMUSCULAR | Status: DC | PRN
  Administered 2014-03-01: 50 ug via INTRAVENOUS
  Administered 2014-03-01: 100 ug via INTRAVENOUS

## 2014-03-01 MED ORDER — CHOLECALCIFEROL 10 MCG (400 UNIT) PO TABS
800.0000 [IU] | ORAL_TABLET | Freq: Every day | ORAL | Status: DC
  Administered 2014-03-02 – 2014-03-08 (×6): 800 [IU] via ORAL
  Filled 2014-03-01 (×7): qty 2

## 2014-03-01 MED ORDER — ONDANSETRON HCL 4 MG PO TABS: 4.0000 mg | ORAL_TABLET | Freq: Four times a day (QID) | ORAL | Status: DC | PRN

## 2014-03-01 MED ORDER — FENTANYL CITRATE 0.05 MG/ML IJ SOLN
25.0000 ug | INTRAMUSCULAR | Status: DC | PRN
  Administered 2014-03-01 (×2): 25 ug via INTRAVENOUS

## 2014-03-01 MED ORDER — ONDANSETRON HCL 4 MG/2ML IJ SOLN
4.0000 mg | Freq: Four times a day (QID) | INTRAMUSCULAR | Status: DC | PRN
Start: 2014-03-01 — End: 2014-03-02

## 2014-03-01 MED ORDER — OXYCODONE HCL 5 MG PO TABS
5.0000 mg | ORAL_TABLET | ORAL | Status: DC | PRN
  Administered 2014-03-02: 5 mg via ORAL
  Administered 2014-03-02: 10 mg via ORAL
  Filled 2014-03-01: qty 2
  Filled 2014-03-01: qty 1

## 2014-03-01 MED ORDER — METOCLOPRAMIDE HCL 5 MG/ML IJ SOLN
5.0000 mg | Freq: Three times a day (TID) | INTRAMUSCULAR | Status: DC | PRN
Start: 2014-03-01 — End: 2014-03-02
  Filled 2014-03-01: qty 2

## 2014-03-01 MED ORDER — SENNOSIDES-DOCUSATE SODIUM 8.6-50 MG PO TABS
1.0000 | ORAL_TABLET | Freq: Every evening | ORAL | Status: DC | PRN
  Administered 2014-03-05: 1 via ORAL
  Filled 2014-03-01: qty 1

## 2014-03-01 MED ORDER — PHENYLEPHRINE HCL 10 MG/ML IJ SOLN
10.0000 mg | INTRAVENOUS | Status: DC | PRN
  Administered 2014-03-01: 30 ug/min via INTRAVENOUS

## 2014-03-01 MED ORDER — PROPOFOL 10 MG/ML IV BOLUS
INTRAVENOUS | Status: DC | PRN
  Administered 2014-03-01: 100 mg via INTRAVENOUS

## 2014-03-01 MED ORDER — SUCCINYLCHOLINE CHLORIDE 20 MG/ML IJ SOLN
INTRAMUSCULAR | Status: DC | PRN
  Administered 2014-03-01: 80 mg via INTRAVENOUS

## 2014-03-01 MED ORDER — GLYCOPYRROLATE 0.2 MG/ML IJ SOLN
INTRAMUSCULAR | Status: DC | PRN
  Administered 2014-03-01: .6 mg via INTRAVENOUS

## 2014-03-01 MED ORDER — CALCIUM CARBONATE 1250 (500 CA) MG PO TABS
1250.0000 mg | ORAL_TABLET | Freq: Every day | ORAL | Status: DC
  Administered 2014-03-02 – 2014-03-08 (×7): 1250 mg via ORAL
  Filled 2014-03-01 (×9): qty 1

## 2014-03-01 MED ORDER — FENTANYL CITRATE 0.05 MG/ML IJ SOLN
INTRAMUSCULAR | Status: AC
  Filled 2014-03-01: qty 2

## 2014-03-01 MED ORDER — ACETAMINOPHEN 325 MG PO TABS: 650.0000 mg | ORAL_TABLET | Freq: Four times a day (QID) | ORAL | Status: DC | PRN

## 2014-03-01 MED ORDER — BISACODYL 10 MG RE SUPP: 10.0000 mg | Freq: Every day | RECTAL | Status: DC | PRN

## 2014-03-01 MED ORDER — ONDANSETRON HCL 4 MG/2ML IJ SOLN
INTRAMUSCULAR | Status: DC | PRN
  Administered 2014-03-01: 4 mg via INTRAVENOUS

## 2014-03-01 MED ORDER — FLEET ENEMA 7-19 GM/118ML RE ENEM
1.0000 | ENEMA | Freq: Once | RECTAL | Status: AC | PRN
  Filled 2014-03-01: qty 1

## 2014-03-01 MED ORDER — ASPIRIN EC 325 MG PO TBEC: 325.0000 mg | DELAYED_RELEASE_TABLET | Freq: Every day | ORAL | Status: DC

## 2014-03-01 MED ORDER — MENTHOL 3 MG MT LOZG
1.0000 | LOZENGE | OROMUCOSAL | Status: DC | PRN
  Filled 2014-03-01: qty 9

## 2014-03-01 MED ORDER — ALBUTEROL SULFATE (2.5 MG/3ML) 0.083% IN NEBU
2.5000 mg | INHALATION_SOLUTION | Freq: Four times a day (QID) | RESPIRATORY_TRACT | Status: DC | PRN
  Administered 2014-03-04: 2.5 mg via RESPIRATORY_TRACT
  Filled 2014-03-01: qty 3

## 2014-03-01 MED ORDER — FENTANYL CITRATE 0.05 MG/ML IJ SOLN
INTRAMUSCULAR | Status: AC
  Filled 2014-03-01: qty 5

## 2014-03-01 MED ORDER — ROCURONIUM BROMIDE 100 MG/10ML IV SOLN
INTRAVENOUS | Status: DC | PRN
  Administered 2014-03-01: 10 mg via INTRAVENOUS

## 2014-03-01 MED ORDER — CEFAZOLIN SODIUM-DEXTROSE 2-3 GM-% IV SOLR
2.0000 g | Freq: Once | INTRAVENOUS | Status: AC
  Administered 2014-03-01: 2 g via INTRAVENOUS
  Filled 2014-03-01: qty 50

## 2014-03-01 MED ORDER — PHENOL 1.4 % MT LIQD
1.0000 | OROMUCOSAL | Status: DC | PRN
  Filled 2014-03-01: qty 177

## 2014-03-01 MED ORDER — ONDANSETRON HCL 4 MG/2ML IJ SOLN: 4.0000 mg | Freq: Once | INTRAMUSCULAR | Status: AC | PRN

## 2014-03-01 MED ORDER — NEOSTIGMINE METHYLSULFATE 10 MG/10ML IV SOLN
INTRAVENOUS | Status: DC | PRN
  Administered 2014-03-01: 3 mg via INTRAVENOUS

## 2014-03-01 MED ORDER — PHENYLEPHRINE HCL 10 MG/ML IJ SOLN
INTRAMUSCULAR | Status: DC | PRN
  Administered 2014-03-01 (×3): 80 ug via INTRAVENOUS

## 2014-03-01 MED ORDER — HYDROCODONE-ACETAMINOPHEN 5-325 MG PO TABS: 1.0000 | ORAL_TABLET | ORAL | Status: DC | PRN

## 2014-03-01 MED ORDER — PROPOFOL 10 MG/ML IV BOLUS
INTRAVENOUS | Status: AC
  Filled 2014-03-01: qty 20

## 2014-03-01 SURGICAL SUPPLY — 65 items
ADH SKN CLS APL DERMABOND .7 (GAUZE/BANDAGES/DRESSINGS) ×1
ADH SKN CLS LQ APL DERMABOND (GAUZE/BANDAGES/DRESSINGS) ×1
BLADE 10 SAFETY STRL DISP (BLADE) ×2 IMPLANT
BLADE SAW SGTL 18X1.27X75 (BLADE) ×2 IMPLANT
BLADE SURG ROTATE 9660 (MISCELLANEOUS) IMPLANT
CANISTER SUCTION 2500CC (MISCELLANEOUS) ×1 IMPLANT
CAPT HIP PF MOP ×1 IMPLANT
CELLS DAT CNTRL 66122 CELL SVR (MISCELLANEOUS) ×1 IMPLANT
COVER SURGICAL LIGHT HANDLE (MISCELLANEOUS) ×2 IMPLANT
DERMABOND ADHESIVE PROPEN (GAUZE/BANDAGES/DRESSINGS) ×1
DERMABOND ADVANCED (GAUZE/BANDAGES/DRESSINGS) ×1
DERMABOND ADVANCED .7 DNX12 (GAUZE/BANDAGES/DRESSINGS) IMPLANT
DERMABOND ADVANCED .7 DNX6 (GAUZE/BANDAGES/DRESSINGS) ×1 IMPLANT
DRAPE C-ARM 42X72 X-RAY (DRAPES) ×2 IMPLANT
DRAPE STERI IOBAN 125X83 (DRAPES) ×2 IMPLANT
DRAPE U-SHAPE 47X51 STRL (DRAPES) ×6 IMPLANT
DRSG MEPILEX BORDER 4X8 (GAUZE/BANDAGES/DRESSINGS) ×2 IMPLANT
DURAPREP 26ML APPLICATOR (WOUND CARE) ×2 IMPLANT
ELECT BLADE 4.0 EZ CLEAN MEGAD (MISCELLANEOUS)
ELECT BLADE TIP CTD 4 INCH (ELECTRODE) ×2 IMPLANT
ELECT CAUTERY BLADE 6.4 (BLADE) ×2 IMPLANT
ELECT REM PT RETURN 9FT ADLT (ELECTROSURGICAL) ×2
ELECTRODE BLDE 4.0 EZ CLN MEGD (MISCELLANEOUS) IMPLANT
ELECTRODE REM PT RTRN 9FT ADLT (ELECTROSURGICAL) ×1 IMPLANT
FACESHIELD WRAPAROUND (MASK) ×4 IMPLANT
FACESHIELD WRAPAROUND OR TEAM (MASK) ×2 IMPLANT
GAUZE XEROFORM 1X8 LF (GAUZE/BANDAGES/DRESSINGS) ×2 IMPLANT
GLOVE BIOGEL PI IND STRL 7.5 (GLOVE) ×1 IMPLANT
GLOVE BIOGEL PI IND STRL 8 (GLOVE) ×1 IMPLANT
GLOVE BIOGEL PI INDICATOR 7.5 (GLOVE) ×1
GLOVE BIOGEL PI INDICATOR 8 (GLOVE) ×1
GLOVE ECLIPSE 7.0 STRL STRAW (GLOVE) ×2 IMPLANT
GLOVE ORTHO TXT STRL SZ7.5 (GLOVE) ×2 IMPLANT
GOWN STRL REUS W/ TWL LRG LVL3 (GOWN DISPOSABLE) ×2 IMPLANT
GOWN STRL REUS W/ TWL XL LVL3 (GOWN DISPOSABLE) ×1 IMPLANT
GOWN STRL REUS W/TWL LRG LVL3 (GOWN DISPOSABLE) ×4
GOWN STRL REUS W/TWL XL LVL3 (GOWN DISPOSABLE) ×2
KIT BASIN OR (CUSTOM PROCEDURE TRAY) ×2 IMPLANT
KIT ROOM TURNOVER OR (KITS) ×2 IMPLANT
LINER ACET PNNCL PLUS4 NEUTRAL (Hips) ×1 IMPLANT
MANIFOLD NEPTUNE II (INSTRUMENTS) ×1 IMPLANT
NS IRRIG 1000ML POUR BTL (IV SOLUTION) ×2 IMPLANT
PACK TOTAL JOINT (CUSTOM PROCEDURE TRAY) ×2 IMPLANT
PAD ARMBOARD 7.5X6 YLW CONV (MISCELLANEOUS) ×4 IMPLANT
PINNACLE PLUS 4 NEUTRAL (Hips) ×2 IMPLANT
RETRACTOR WND ALEXIS 18 MED (MISCELLANEOUS) ×1 IMPLANT
RTRCTR WOUND ALEXIS 18CM MED (MISCELLANEOUS) ×2
SCREW PINN CAN BONE 6.5MMX15MM (Screw) ×1 IMPLANT
SLEEVE SURGEON STRL (DRAPES) ×1 IMPLANT
SPONGE GAUZE 4X4 12PLY STER LF (GAUZE/BANDAGES/DRESSINGS) ×1 IMPLANT
SPONGE LAP 18X18 X RAY DECT (DISPOSABLE) ×2 IMPLANT
SPONGE LAP 4X18 X RAY DECT (DISPOSABLE) IMPLANT
STAPLER VISISTAT 35W (STAPLE) ×2 IMPLANT
SUT ETHIBOND NAB CT1 #1 30IN (SUTURE) IMPLANT
SUT VIC AB 0 CT1 27 (SUTURE) ×2
SUT VIC AB 0 CT1 27XBRD ANBCTR (SUTURE) ×1 IMPLANT
SUT VIC AB 2-0 CT1 27 (SUTURE) ×2
SUT VIC AB 2-0 CT1 TAPERPNT 27 (SUTURE) ×1 IMPLANT
SUT VICRYL 4-0 PS2 18IN ABS (SUTURE) ×2 IMPLANT
SUT VLOC 180 0 24IN GS25 (SUTURE) ×2 IMPLANT
TAPE CLOTH SURG 4X10 WHT LF (GAUZE/BANDAGES/DRESSINGS) ×1 IMPLANT
TOWEL OR 17X24 6PK STRL BLUE (TOWEL DISPOSABLE) ×2 IMPLANT
TOWEL OR 17X26 10 PK STRL BLUE (TOWEL DISPOSABLE) ×4 IMPLANT
TRAY FOLEY CATH 16FRSI W/METER (SET/KITS/TRAYS/PACK) IMPLANT
WATER STERILE IRR 1000ML POUR (IV SOLUTION) ×4 IMPLANT

## 2014-03-01 NOTE — Interval H&P Note (Signed)
History and Physical Interval Note:  03/01/2014 3:07 PM  Michele Johns  has presented today for surgery, with the diagnosis of Right Femoral Neck Fracture  The various methods of treatment have been discussed with the patient and family. After consideration of risks, benefits and other options for treatment, the patient has consented to  Procedure(s) with comments: [place] (Right) - wants Hana Table, Corial Stem, Depuy, C-arm as a surgical intervention .  The patient's history has been reviewed, patient examined, no change in status, stable for surgery.  I have reviewed the patient's chart and labs.  Questions were answered to the patient's satisfaction.     Marybelle Killings

## 2014-03-01 NOTE — Progress Notes (Signed)
Subjective: No acute events overnight. She tolerated the pain well. Today, she was resting comfortably on her bed with her daughter in the room. There were no acute complaints. They anticipated the surgery to be around 3 pm today. The orthopedic surgeons had come by the room to talk to her and explained the procedure. She was not complaining of any significant pain. She denied any SOB, chest pain, calf tenderness, nausea or vomiting.  Objective: Vital signs in last 24 hours: Filed Vitals:   02/28/14 1628 02/28/14 1733 02/28/14 2116 03/01/14 0340  BP: 143/55 152/60 133/45 128/35  Pulse: 78 81 81 77  Temp:  97.6 F (36.4 C) 97.5 F (36.4 C) 98.2 F (36.8 C)  TempSrc:  Oral Oral Oral  Resp: 18 18 18 18   Height:  5\' 2"  (1.575 m)    Weight:  43.817 kg (96 lb 9.6 oz)    SpO2: 98% 98% 95% 94%   Weight change:   Intake/Output Summary (Last 24 hours) at 03/01/14 1323 Last data filed at 03/01/14 0904  Gross per 24 hour  Intake    360 ml  Output      0 ml  Net    360 ml   BP 128/35  Pulse 77  Temp(Src) 98.2 F (36.8 C) (Oral)  Resp 18  Ht 5\' 2"  (1.575 m)  Wt 43.817 kg (96 lb 9.6 oz)  BMI 17.66 kg/m2  SpO2 94%  General Appearance:    Alert, cooperative, no distress, appears stated age  Head:    Normocephalic, without obvious abnormality, atraumatic  Eyes:    PERRL, conjunctiva/corneas clear, EOM's intact  Ears:    Normal TM's and external ear canals, both ears  Nose:   Nares normal, septum midline, mucosa normal, no drainage    or sinus tenderness  Throat:   Lips, mucosa, and tongue normal; teeth and gums normal  Neck:   Supple, symmetrical, trachea midline, no adenopathy;    thyroid:  no enlargement/tenderness/nodules; no carotid   bruit or JVD  Back:     Symmetric, no curvature, ROM normal, no CVA tenderness  Lungs:     Clear to auscultation bilaterally, respirations unlabored  Chest Wall:    No tenderness or deformity   Heart:    Regular rate and rhythm, S1 and S2 normal,  no murmur, rub   or gallop  Abdomen:     Soft, non-tender, bowel sounds active all four quadrants,    no masses, no organomegaly  Musckuloskeletal: Tenderness to light palpation on R hip and upper R leg. Unable to assess movement due to pain elicited on R leg.   Pulses:   2+ and symmetric all extremities  Skin:   Skin color, texture, turgor normal, no rashes or lesions  Lymph nodes:   Cervical, supraclavicular, and axillary nodes normal  Neurologic:   CNII-XII intact, normal strength, sensation and reflexes    throughout   Lab Results: Labs reviewed. No acute abnormalities   Micro Results: No results found for this or any previous visit (from the past 240 hour(s)). Studies/Results: Dg Chest 2 View  02/28/2014   CLINICAL DATA:  Syncope  EXAM: CHEST  2 VIEW  COMPARISON:  None.  FINDINGS: The cardiac and mediastinal silhouettes are within normal limits. Atherosclerotic calcifications noted within the aortic arch.  The lungs are mildly hyperinflated with attenuation of the pulmonary markings, compatible with COPD. Marland Kitchen No airspace consolidation, pleural effusion, or pulmonary edema is identified. There is no pneumothorax.  No acute  osseous abnormality identified.  IMPRESSION: 1. No acute cardiopulmonary abnormality. 2. COPD.   Electronically Signed   By: Jeannine Boga M.D.   On: 02/28/2014 14:23   Dg Hip Complete Right  02/28/2014   CLINICAL DATA:  Right anterior groin pain radiating distally. Limited range of motion.  EXAM: RIGHT HIP - COMPLETE 2+ VIEW  COMPARISON:  None available.  FINDINGS: There is an acute nondisplaced fracture through the right femoral neck with slight superior subluxation. The femoral head remains normally aligned within the acetabulum. Femoral head height is preserved. Limited views of the left hip are unremarkable. No pubic dye stasis. SI joints are approximated. Bony pelvis is intact.  Diffuse osteopenia noted. Degenerative changes present within the lower lumbar  spine.  IMPRESSION: Acute nondisplaced fracture through the right femoral neck.   Electronically Signed   By: Jeannine Boga M.D.   On: 02/28/2014 14:25   Medications:  Prior to Admission:  Prescriptions prior to admission  Medication Sig Dispense Refill  . Multiple Vitamin (MULTIVITAMIN WITH MINERALS) TABS tablet Take 1 tablet by mouth daily.      . naproxen sodium (ANAPROX) 220 MG tablet Take 440 mg by mouth daily.       Scheduled Meds: .  ceFAZolin (ANCEF) IV  2 g Intravenous Once  . multivitamin with minerals  1 tablet Oral Daily  . sodium chloride  3 mL Intravenous Q12H  . sodium chloride  3 mL Intravenous Q12H   Continuous Infusions:  PRN Meds:.albuterol, docusate sodium, HYDROcodone-acetaminophen, promethazine, senna, sodium chloride  Assessment & Plan by Problem:   Syncope in setting of hypotension - Pt with 1st episode of syncope in setting of hypotension (80/60) most likely secondary to orthostatic hypotension in setting of severe pain and hypovolemia. Cardiac etiology unllikely given no CP/palpitations, negative troponin, and no ischemic changes on 12-lead EKG. No neurological deficits to suggest neurological cause. No seizure activity witnessed. Vasovagal response also unlikely as pt was walking when episode occurred. No new medications or electrolytes  abnormalities.  -Orthostatic labs if able to tolerate  -Cycle troponins  -Monitor on telemetry  -Frequent neuro checks  -NS 75 mL/hr   Acute Nondisplaced Right Femoral Neck Fracture in setting of diffuse osteopenia - On XR right hip on 5/12. Pt with 2 week history of right hip/leg pain with no falls, injuries, or trauma. Pt with no prior BMD and calcium levels normal. Etiology most likely due to undiagnosed osteoporosis due to advanced (80 yo F).  -Appreciate orthopedic recommendations  -Anterior THA on 5/13 -Norco/vicodin 1-2 Q 4hr pain  -Loxenox DVT prophylaxis  -Obtain Vit 25 OH levels  -Consider calcium/vitmain  D supplementation  -PT/OT once able to tolerate  -Consider SNF   Asymptomatic Cystitis - Pt with UA on 5/12 with small LE, pyuria (7-10), and bacteruria (many). Also with calcium oxalate crystals. WBC elevated at 14.7K with neutrophilia (92%).  -Monitor for symptoms  -No antibiotic therapy indicated at this time  -F/U urinary cultures   Hyperglycemia - Glucose of 135 on CMP with no history of DM.  -A1c showed 5.6. No further intervention needed  Code: Full  DVT Ppx: Lovenox daily  Diet: Regular, NPO since midnight   Dispo: Disposition is deferred at this time, awaiting improvement of current medical problems.  The patient does not have a current PCP (No primary provider on file.) and does need an [facility] hospital follow-up appointment after discharge.  The patient does not have transportation limitations that hinder transportation to clinic appointments.  Dispo: Disposition is deferred at this time, awaiting improvement of current medical problems.  Anticipated discharge in approximately 4-5 day(s).   The patient does not have a current PCP (No primary provider on file.) and does need an [facility] hospital follow-up appointment after discharge.  The patient does have transportation limitations that hinder transportation to clinic appointments.  .Services Needed at time of discharge: Y = Yes, Blank = No PT:   OT:   RN:   Equipment:   Other:     LOS: 1 day   Judithe Modest, Med Student 03/01/2014, 1:23 PM]

## 2014-03-01 NOTE — H&P (Signed)
  I have seen and examined the patient myself, and I have reviewed the note by Jori Moll L. [place] , [place] and was present during the interview and physical exam.  Please see my separate H&P for additional findings, assessment, and plan.   Signed: Juluis Mire, MD 03/01/2014, 12:37 AM

## 2014-03-01 NOTE — Anesthesia Procedure Notes (Signed)
Procedure Name: Intubation Date/Time: 03/01/2014 4:29 PM Performed by: Eligha Bridegroom Pre-anesthesia Checklist: Patient identified, Patient being monitored, Emergency Drugs available, Timeout performed and Suction available Patient Re-evaluated:Patient Re-evaluated prior to inductionOxygen Delivery Method: Circle system utilized Preoxygenation: Pre-oxygenation with 100% oxygen Intubation Type: IV induction Ventilation: Mask ventilation without difficulty Laryngoscope Size: Mac and 3 Grade View: Grade I Tube type: Oral Tube size: 7.0 mm Number of attempts: 1 Airway Equipment and Method: Stylet Placement Confirmation: positive ETCO2,  ETT inserted through vocal cords under direct vision and breath sounds checked- equal and bilateral Secured at: 21 cm Tube secured with: Tape Dental Injury: Teeth and Oropharynx as per pre-operative assessment

## 2014-03-01 NOTE — Brief Op Note (Signed)
02/28/2014 - 03/01/2014  6:15 PM  PATIENT:  Michele Johns  80 y.o. female  PRE-OPERATIVE DIAGNOSIS:  Right Femoral Neck Fracture  POST-OPERATIVE DIAGNOSIS:  Right Femoral Neck Fracture  PROCEDURE:  Procedure(s): TOTAL HIP ARTHROPLASTY ANTERIOR APPROACH (Right)  SURGEON:  Surgeon(s) and Role:    Marybelle Killings, MD - Primary  PHYSICIAN ASSISTANT: Phillips [facility]  ASSISTANTS: none   ANESTHESIA:   general  EBL:  Total I/O In: 2449 [P.O.:360; I.V.:1000; Other:100] Out: 350 [Urine:200; Blood:150]  BLOOD ADMINISTERED:none  DRAINS: none   LOCAL MEDICATIONS USED:  NONE  SPECIMEN:  No Specimen  DISPOSITION OF SPECIMEN:  N/A  COUNTS:  YES  TOURNIQUET:  * No tourniquets in log *  DICTATION: .Note written in EPIC  PLAN OF CARE: admit  PATIENT DISPOSITION:  PACU - hemodynamically stable.   Delay start of Pharmacological VTE agent (>24hrs) due to surgical blood loss or risk of bleeding: no

## 2014-03-01 NOTE — Transfer of Care (Signed)
Immediate Anesthesia Transfer of Care Note  Patient: Michele Johns  Procedure(s) Performed: Procedure(s): TOTAL HIP ARTHROPLASTY ANTERIOR APPROACH (Right)  Patient Location: PACU  Anesthesia Type:General  Level of Consciousness: awake and alert   Airway & Oxygen Therapy: Patient Spontanous Breathing and Patient connected to nasal cannula oxygen  Post-op Assessment: Report given to PACU RN and Post -op Vital signs reviewed and stable  Post vital signs: Reviewed and stable  Complications: No apparent anesthesia complications

## 2014-03-01 NOTE — Progress Notes (Signed)
Orthopedic Tech Progress Note Patient Details:  Michele Johns 23-Jan-1930 [id number]  Ortho Devices Ortho Device/Splint Location: applied overhead frame to bed Ortho Device/Splint Interventions: Ordered;Application   [name] 03/01/2014, 8:55 PM

## 2014-03-01 NOTE — H&P (Signed)
INTERNAL MEDICINE TEACHING ATTENDING NOTE  Day 1 of stay  Patient name: Michele Johns  MRN: [id number] Date of birth: Aug 11, 1930   80 y.o.female with undisplaced hip fracture and syncope possibly vasovagal secondary to pain. I met with the patient and her daughter Michele Johns this morning. The patient appears to be doing well, improved pain on pain meds, with no further tele events. I agree with the exam documented by Dr Naaman Plummer.  Appreciate ortho recommendations. The patient is scheduled for OR today for a THR. We will reassess the patient after the surgery is done. We will start calcium 1200 mg and vitamin D 800 IU here. Will defer considering bisphosphonates per primary physician on discharge.   I have seen and evaluated this patient and discussed it with my IM resident team.  Please see the rest of the plan per resident note from today.   [name] 03/01/2014, 1:32 PM.

## 2014-03-01 NOTE — Discharge Instructions (Signed)
50% partial weight bearing on right leg with Ruff[place].  Change dressing daily or as needed.  Keep wound covered. Ice to hip as needed for pain and swelling

## 2014-03-01 NOTE — Anesthesia Preprocedure Evaluation (Addendum)
Anesthesia Evaluation  Patient identified by MRN, date of birth, ID band Patient awake    Reviewed: Allergy & Precautions, H&P , NPO status , Patient's Chart, lab work & pertinent test results, reviewed documented beta blocker date and time   Airway Mallampati: II      Dental  (+) Edentulous Upper, Edentulous Lower   Pulmonary    Pulmonary exam normal       Cardiovascular Rhythm:Regular     Neuro/Psych    GI/Hepatic   Endo/Other    Renal/GU      Musculoskeletal   Abdominal Normal abdominal exam  (+)   Peds  Hematology   Anesthesia Other Findings   Reproductive/Obstetrics                          Anesthesia Physical Anesthesia Plan  ASA: II  Anesthesia Plan: General   Post-op Pain Management:    Induction: Intravenous  Airway Management Planned: Oral ETT  Additional Equipment:   Intra-op Plan:   Post-operative Plan:   Informed Consent:   Plan Discussed with:   Anesthesia Plan Comments:         Anesthesia Quick Evaluation

## 2014-03-01 NOTE — Interval H&P Note (Signed)
History and Physical Interval Note:  03/01/2014 12:58 PM  Michele Johns  has presented today for surgery, with the diagnosis of Right Femoral Neck Fracture  The various methods of treatment have been discussed with the patient and family. After consideration of risks, benefits and other options for treatment, the patient has consented to  Procedure(s) with comments: [place] (Right) - wants Hana Table, Corial Stem, Depuy, C-arm as a surgical intervention .  The patient's history has been reviewed, patient examined, no change in status, stable for surgery.  I have reviewed the patient's chart and labs.  Questions were answered to the patient's satisfaction.     Michele Johns

## 2014-03-01 NOTE — H&P (View-Only) (Signed)
Patient ID: Michele Johns, female   DOB: May 06, 1930, 80 y.o.   MRN: [id number] Discussed with pt and family surgical   Options patient wants to proceed with THA since she would be limited with TDWB with  Hip pinning.  Consent ordered. Patient likely had a stress fracture of the femoral neck  Which displaced as she was getting out of car to go to the urgent care .  Was not able to walk and xrays showed femoral neck fracture.  Plan right THA tomorrow afternoon.   Clear liquid breakfast then NPO

## 2014-03-01 NOTE — Progress Notes (Signed)
Report given to [facility]

## 2014-03-01 NOTE — Progress Notes (Signed)
  I have seen and examined the patient, and reviewed the daily progress note by Jori Moll L. [place], [place] [address] and discussed the care of the patient with them. Please see my progress note from 03/01/2014 for further details regarding assessment and plan.    Signed:  Juluis Mire, MD 03/01/2014, 8:32 PM

## 2014-03-01 NOTE — Progress Notes (Addendum)
Subjective:  Pt seen and examined in AM. No acute events overnight. Pt reports hip pain relieved with norco however still unable to move it. Pt ready for hip replacement in afternoon.   Objective: Vital signs in last 24 hours: Filed Vitals:   02/28/14 1733 02/28/14 2116 03/01/14 0340 03/01/14 1409  BP: 152/60 133/45 128/35 107/31  Pulse: 81 81 77 75  Temp: 97.6 F (36.4 C) 97.5 F (36.4 C) 98.2 F (36.8 C) 98.6 F (37 C)  TempSrc: Oral Oral Oral Oral  Resp: 18 18 18 18   Height: 5\' 2"  (1.575 m)     Weight: 43.817 kg (96 lb 9.6 oz)     SpO2: 98% 95% 94% 96%   Weight change:   Intake/Output Summary (Last 24 hours) at 03/01/14 1729 Last data filed at 03/01/14 1700  Gross per 24 hour  Intake    460 ml  Output    350 ml  Net    110 ml   PHYSICAL EXAM:  General: NAD Heart: Normal rate and rhythm   Lungs: Decreased breath sounds. No wheezing, ronchi, or rales Abdomen: Soft, non-distended, non-tender, normal BS Musculoskeletal: Cannot move right leg or hip due to pain Neuro: A & O x 3  Lab Results: Basic Metabolic Panel:  Recent Labs Lab 02/28/14 1319  NA 143  K 4.5  CL 106  CO2 24  GLUCOSE 135*  BUN 20  CREATININE 0.78  CALCIUM 9.2   Liver Function Tests:  Recent Labs Lab 02/28/14 1319  AST 29  ALT 17  ALKPHOS 83  BILITOT 0.9  PROT 6.3  ALBUMIN 3.1*   CBC:  Recent Labs Lab 02/28/14 1319 03/01/14 0117  WBC 14.7* 9.9  NEUTROABS 13.4* 7.9*  HGB 13.8 13.1  HCT 41.3 39.4  MCV 92.8 92.5  PLT 161 130*   Cardiac Enzymes:  Recent Labs Lab 02/28/14 1319 02/28/14 1928 03/01/14 0117  TROPONINI <0.30 <0.30 <0.30   Hemoglobin A1C:  Recent Labs Lab 03/01/14 0117  HGBA1C 5.6    Urinalysis:  Recent Labs Lab 02/28/14 1401  COLORURINE AMBER*  LABSPEC 1.042*  PHURINE 6.5  GLUCOSEU NEGATIVE  HGBUR NEGATIVE  BILIRUBINUR NEGATIVE  KETONESUR 15*  PROTEINUR >300*  UROBILINOGEN 1.0  NITRITE NEGATIVE  LEUKOCYTESUR SMALL*      Studies/Results: Dg Chest 2 View  02/28/2014   CLINICAL DATA:  Syncope  EXAM: CHEST  2 VIEW  COMPARISON:  None.  FINDINGS: The cardiac and mediastinal silhouettes are within normal limits. Atherosclerotic calcifications noted within the aortic arch.  The lungs are mildly hyperinflated with attenuation of the pulmonary markings, compatible with COPD. Marland Kitchen No airspace consolidation, pleural effusion, or pulmonary edema is identified. There is no pneumothorax.  No acute osseous abnormality identified.  IMPRESSION: 1. No acute cardiopulmonary abnormality. 2. COPD.   Electronically Signed   By: Jeannine Boga M.D.   On: 02/28/2014 14:23   Dg Hip Complete Right  02/28/2014   CLINICAL DATA:  Right anterior groin pain radiating distally. Limited range of motion.  EXAM: RIGHT HIP - COMPLETE 2+ VIEW  COMPARISON:  None available.  FINDINGS: There is an acute nondisplaced fracture through the right femoral neck with slight superior subluxation. The femoral head remains normally aligned within the acetabulum. Femoral head height is preserved. Limited views of the left hip are unremarkable. No pubic dye stasis. SI joints are approximated. Bony pelvis is intact.  Diffuse osteopenia noted. Degenerative changes present within the lower lumbar spine.  IMPRESSION: Acute nondisplaced fracture through  the right femoral neck.   Electronically Signed   By: Jeannine Boga M.D.   On: 02/28/2014 14:25   Medications: I have reviewed the patient's current medications. Scheduled Meds: . multivitamin with minerals  1 tablet Oral Daily  . sodium chloride  3 mL Intravenous Q12H  . sodium chloride  3 mL Intravenous Q12H   Continuous Infusions:  PRN Meds:.albuterol, docusate sodium, HYDROcodone-acetaminophen, promethazine, senna, sodium chloride Assessment/Plan:   Acute Nondisplaced Right Femoral Neck Fracture s/p anterior THR on 5/13 - Etiology most likely from undiagnosed osteoporosis due to advanced  age. -Appreciate orthopedic recommendations  -Norco/vicodin 1-2 Q 4hr pain  -Loxenox & SCD's for DVT prophylaxis  -F/U Vit 25 OH levels  -Start oscal 1250 mg daily & vitmain D 800 U daily -Consider outpatient DEXA scan & bisphosphonate therapy   -PT/OT once able to tolerate   Prolonged QT - 12-lead EKFG on 5/12 with QTc 503. -Avoid QT prolonging medications   Thrombocytopenia - Platelet count 130K, down from 161K, possibly due to hemodilution.  -Monitor CBC -Monitor in setting of heparin use  Possible Undiagnosed COPD - Pt denies respiratory symptoms with no smoking history. CXR on 5/12 with lungs that are mildly hyperinflated with attenuation of the pulmonary markings, compatible with COPD.  -Outpatient follow-up as needed  -Albuterol PRN   Pyuria without cystitis- Pt with UA on 5/12 with small LE, pyuria (7-10), and bacteruria (many). No leukocytosis.  -Monitor for symptoms  -No antibiotic therapy indicated at this time  Hyperglycemia - A1c 5.7 on 5/13   -Monitor for prediabetes  Code: Full  DVT Ppx: Lovenox daily, SCD's  Diet: Regular    Dispo: Disposition is deferred at this time, awaiting improvement of current medical problems.    The patient does not have a current PCP (No primary provider on file.) and does need an [facility] hospital follow-up appointment after discharge.  The patient does have transportation limitations that hinder transportation to clinic appointments.  .Services Needed at time of discharge: Y = Yes, Blank = No PT:   OT:   RN:   Equipment:   Other:     LOS: 1 day   Juluis Mire, MD 03/01/2014, 5:29 PM

## 2014-03-01 NOTE — Anesthesia Postprocedure Evaluation (Signed)
Anesthesia Post Note  Patient: Michele Johns  Procedure(s) Performed: Procedure(s) (LRB): TOTAL HIP ARTHROPLASTY ANTERIOR APPROACH (Right)  Anesthesia type: General  Patient location: PACU  Post pain: Pain level controlled and Adequate analgesia  Post assessment: Post-op Vital signs reviewed, Patient's Cardiovascular Status Stable, Respiratory Function Stable, Patent Airway and Pain level controlled  Last Vitals:  Filed Vitals:   03/01/14 1930  BP: 106/33  Pulse: 82  Temp: 36.6 C  Resp: 12    Post vital signs: Reviewed and stable  Level of consciousness: awake, alert  and oriented  Complications: No apparent anesthesia complications

## 2014-03-02 DIAGNOSIS — D696 Thrombocytopenia, unspecified: Secondary | ICD-10-CM

## 2014-03-02 LAB — BASIC METABOLIC PANEL
BUN: 27 mg/dL — ABNORMAL HIGH (ref 6–23)
BUN: 28 mg/dL — ABNORMAL HIGH (ref 6–23)
CALCIUM: 8.4 mg/dL (ref 8.4–10.5)
CHLORIDE: 102 meq/L (ref 96–112)
CO2: 26 mEq/L (ref 19–32)
CO2: 22 meq/L (ref 19–32)
Calcium: 8.3 mg/dL — ABNORMAL LOW (ref 8.4–10.5)
Chloride: 113 mEq/L — ABNORMAL HIGH (ref 96–112)
Creatinine, Ser: 1.27 mg/dL — ABNORMAL HIGH (ref 0.50–1.10)
Creatinine, Ser: 1.22 mg/dL — ABNORMAL HIGH (ref 0.50–1.10)
GFR calc Af Amer: 45 mL/min — ABNORMAL LOW (ref >90)
GFR calc Af Amer: 47 mL/min — ABNORMAL LOW (ref >90)
GFR calc non Af Amer: 40 mL/min — ABNORMAL LOW (ref >90)
GFR, EST NON AFRICAN AMERICAN: 38 mL/min — AB (ref >90)
Glucose, Bld: 114 mg/dL — ABNORMAL HIGH (ref 70–99)
Glucose, Bld: 142 mg/dL — ABNORMAL HIGH (ref 70–99)
POTASSIUM: 4.2 meq/L (ref 3.7–5.3)
Potassium: 5.3 mEq/L (ref 3.7–5.3)
SODIUM: 144 meq/L (ref 137–147)
SODIUM: 138 meq/L (ref 137–147)

## 2014-03-02 LAB — CBC
HCT: 29.2 % — ABNORMAL LOW (ref 36.0–46.0)
HEMOGLOBIN: 9.6 g/dL — AB (ref 12.0–15.0)
MCH: 30.7 pg (ref 26.0–34.0)
MCHC: 32.9 g/dL (ref 30.0–36.0)
MCV: 93.3 fL (ref 78.0–100.0)
Platelets: 134 10*3/uL — ABNORMAL LOW (ref 150–400)
RBC: 3.13 MIL/uL — ABNORMAL LOW (ref 3.87–5.11)
RDW: 14.4 % (ref 11.5–15.5)
WBC: 10.8 10*3/uL — ABNORMAL HIGH (ref 4.0–10.5)

## 2014-03-02 LAB — VITAMIN D 25 HYDROXY (VIT D DEFICIENCY, FRACTURES): VIT D 25 HYDROXY: 64 ng/mL (ref 30–89)

## 2014-03-02 MED ORDER — ENOXAPARIN SODIUM 30 MG/0.3ML ~~LOC~~ SOLN
30.0000 mg | SUBCUTANEOUS | Status: DC
  Administered 2014-03-02: 30 mg via SUBCUTANEOUS
  Filled 2014-03-02 (×2): qty 0.3

## 2014-03-02 MED ORDER — SODIUM CHLORIDE 0.9 % IV BOLUS (SEPSIS)
1000.0000 mL | Freq: Once | INTRAVENOUS | Status: AC
  Administered 2014-03-02: 1000 mL via INTRAVENOUS

## 2014-03-02 MED ORDER — PROMETHAZINE HCL 25 MG/ML IJ SOLN: 12.5000 mg | INTRAMUSCULAR | Status: DC | PRN

## 2014-03-02 MED ORDER — SODIUM CHLORIDE 0.9 % IV SOLN
INTRAVENOUS | Status: AC
  Administered 2014-03-02 – 2014-03-03 (×3): via INTRAVENOUS

## 2014-03-02 MED ORDER — SODIUM CHLORIDE 0.9 % IV BOLUS (SEPSIS)
1000.0000 mL | Freq: Once | INTRAVENOUS | Status: AC
Start: 2014-03-02 — End: 2014-03-02

## 2014-03-02 MED ORDER — MORPHINE SULFATE 2 MG/ML IJ SOLN: 1.0000 mg | INTRAMUSCULAR | Status: DC | PRN

## 2014-03-02 NOTE — Progress Notes (Addendum)
Pt urinated approximately 75cc at 2145 after bolus of fluid.  Will make MD aware.  Will continue to monitor pt closely.  Call bell within reach.  Pt without any discomfort at time.     [id number] MD aware. No new orders received.

## 2014-03-02 NOTE — Evaluation (Signed)
Occupational Therapy Evaluation Patient Details Name: Michele Johns MRN: [id number] DOB: 09/08/1930 Today's Date: 03/02/2014    History of Present Illness 80 yo female s/p RT THA with anterior approach   Clinical Impression   Patient is s/p RT THA  surgery resulting in functional limitations due to the deficits listed below (see OT problem list).  Patient will benefit from skilled OT acutely to increase independence and safety with ADLS to allow discharge SNF. Pt PTA was independent and driving.     Follow Up Recommendations  SNF    Equipment Recommendations  None recommended by OT    Recommendations for Other Services       Precautions / Restrictions Precautions Precautions: Fall;Anterior Hip Precaution Booklet Issued: Yes (comment) Restrictions Weight Bearing Restrictions: Yes RLE Weight Bearing: Partial weight bearing RLE Partial Weight Bearing Percentage or Pounds: 50%      Mobility Bed Mobility Overal bed mobility: Needs Assistance Bed Mobility: Supine to Sit     Supine to sit: Mod assist;HOB elevated     General bed mobility comments: cue for hand placement and safety with RT LE to maintain hip preacutions  Transfers Overall transfer level: Needs assistance Equipment used: Rolling [place] (2 wheeled) Transfers: Sit to/from Stand Sit to Stand: Min assist;+2 physical assistance         General transfer comment: Cues for hand placement and for foot placemetn.  Incr time to stand.  Somewhat flexed needing cues for upright posture. Pt attempting NWB RT initially. pt educated on 50% weight bearing    Balance Overall balance assessment: Needs assistance Sitting-balance support: Feet supported;Bilateral upper extremity supported Sitting balance-Leahy Scale: Fair     Standing balance support: Bilateral upper extremity supported;During functional activity Standing balance-Leahy Scale: Poor Standing balance comment: Needs UE support and physical assist to  maintain standing balance.                              ADL Overall ADL's : Needs assistance/impaired Eating/Feeding: Modified independent;Sitting   Grooming: Oral care;Sitting;Set up                   Toilet Transfer: Minimal assistance;+2 for physical assistance;Stand-pivot;RW;BSC           Functional mobility during ADLs: Minimal assistance;+2 for physical assistance;Rolling [place] General ADL Comments: pt supine on arrival and very anxious about mobility. pt progressed to EOB with (A). Pt educated on precautions. pt provided handout and sign hung in room. pt ambulated x3 steps forward and then pivot to chair. Pt progressing well. Daughter requesting SNF for short term until pt has progressed to supervision level     Vision                     Perception     Praxis      Pertinent Vitals/Pain VSS Tolerating activity well     Hand Dominance Right   Extremity/Trunk Assessment Upper Extremity Assessment Upper Extremity Assessment: Overall WFL for tasks assessed   Lower Extremity Assessment Lower Extremity Assessment: Defer to PT evaluation RLE Deficits / Details: grossly 3-/5 RLE: Unable to fully assess due to pain   Cervical / Trunk Assessment Cervical / Trunk Assessment: Kyphotic   Communication Communication Communication: No difficulties   Cognition Arousal/Alertness: Awake/alert Behavior During Therapy: WFL for tasks assessed/performed Overall Cognitive Status: Within Functional Limits for tasks assessed  General Comments       Exercises Exercises: General Lower Extremity     Shoulder Instructions      Home Living Family/patient expects to be discharged to:: Private residence Living Arrangements: Alone Available Help at Discharge: Family;Available 24 hours/day Type of Home: Other(Comment) (condo) Home Access: Level entry     Home Layout: One level     Bathroom Shower/Tub: Arts administrator: Handicapped height     Home Equipment: Shower seat - built in          Prior Functioning/Environment Level of Independence: Independent             OT Diagnosis: Generalized weakness;Acute pain   OT Problem List: Decreased strength;Decreased activity tolerance;Impaired balance (sitting and/or standing);Decreased cognition;Decreased safety awareness;Decreased knowledge of use of DME or AE;Decreased knowledge of precautions;Pain   OT Treatment/Interventions: Self-care/ADL training;Therapeutic exercise;DME and/or AE instruction;Therapeutic activities;Patient/family education;Balance training    OT Goals(Current goals can be found in the care plan section) Acute Rehab OT Goals Patient Stated Goal: to go home OT Goal Formulation: With patient Time For Goal Achievement: 03/16/14 Potential to Achieve Goals: Good  OT Frequency: Min 2X/week   Barriers to D/C:            Co-evaluation              End of Session Nurse Communication: Mobility status;Precautions  Activity Tolerance: Patient tolerated treatment well Patient left: in chair;with call bell/phone within reach;with family/visitor present   Time: 4665-9935 OT Time Calculation (min): 28 min Charges:  OT General Charges $OT Visit: 1 Procedure OT Evaluation $Initial OT Evaluation Tier I: 1 Procedure OT Treatments $Self Care/Home Management : 23-37 mins G-Codes:    Peri Maris 03-29-2014, 1:58 PM Pager: [phone]

## 2014-03-02 NOTE — Evaluation (Signed)
Physical Therapy Evaluation Patient Details Name: Michele Johns MRN: [id number] DOB: 12-09-1929 Today's Date: 03/02/2014   History of Present Illness  Pt admit for right THA.    Clinical Impression  Pt admitted with above. Pt currently with functional limitations due to the deficits listed below (see PT Problem List). Pt will need [facility] for therapy as she fatigues quickly with activity and will be alone at times. Also with difficulty with PWB 50% as she fatigues.   Pt and daughter agree with [facility].   Pt will benefit from skilled PT to increase their independence and safety with mobility to allow discharge to the venue listed below.     Follow Up Recommendations SNF;Supervision/Assistance - 24 hour    Equipment Recommendations  Other (comment) (TBA)    Recommendations for Other Services       Precautions / Restrictions Precautions Precautions: Fall;Anterior Hip Precaution Booklet Issued: Yes (comment) Restrictions Weight Bearing Restrictions: Yes RLE Weight Bearing: Partial weight bearing RLE Partial Weight Bearing Percentage or Pounds: 50%      Mobility  Bed Mobility                  Transfers Overall transfer level: Needs assistance Equipment used: Rolling Walkup (2 wheeled) Transfers: Sit to/from Stand Sit to Stand: Mod assist         General transfer comment: Cues for hand placement and for foot placemetn.  Incr time to stand.  Somewhat flexed needing cues for upright posture.  Ambulation/Gait Ambulation/Gait assistance: Mod assist;Min assist;+2 safety/equipment Ambulation Distance (Feet): 20 Feet (15 feet and then 5 feet) Assistive device: Rolling Saur (2 wheeled) Gait Pattern/deviations: Step-to pattern;Decreased step length - right;Decreased stance time - right;Decreased weight shift to right;Antalgic;Trunk flexed;Narrow base of support   Gait velocity interpretation: Below normal speed for age/gender General Gait Details: Pt initially able to follow  cues for ambulation and maintain PWB 50% however when pt began to fatigue, pt with flexed posture as well as difficulty following sequencing of steps and RW.  Had to pull chair up behind pt for pt to sit down as she fatigued after 15 feet.  Then after a sitting rest, pt ambulated 5 feet back to chair.    Stairs            Wheelchair Mobility    Modified Rankin (Stroke Patients Only)       Balance Overall balance assessment: Needs assistance;History of Falls         Standing balance support: Bilateral upper extremity supported;During functional activity Standing balance-Leahy Scale: Poor Standing balance comment: Needs UE support and physical assist to maintain standing balance.                               Pertinent Vitals/Pain VSS, 8/10 pain right hip with pt premedicated.     Home Living Family/patient expects to be discharged to:: Private residence Living Arrangements: Alone Available Help at Discharge: Family;Available 24 hours/day Type of Home: Other(Comment) (condo) Home Access: Level entry     Home Layout: One level Home Equipment: Shower seat - built in      Prior Function Level of Independence: Independent               Hand Dominance   Dominant Hand: Right    Extremity/Trunk Assessment   Upper Extremity Assessment: Defer to OT evaluation           Lower Extremity Assessment: RLE  deficits/detail RLE Deficits / Details: grossly 3-/5       Communication   Communication: No difficulties  Cognition Arousal/Alertness: Awake/alert Behavior During Therapy: WFL for tasks assessed/performed Overall Cognitive Status: Within Functional Limits for tasks assessed                      General Comments      Exercises General Exercises - Lower Extremity Ankle Circles/Pumps: AROM;Both;10 reps;Supine Quad Sets: AROM;Both;10 reps;Seated Long Arc Quad: AROM;Both;10 reps;Seated Heel Slides: AROM;Both;10 reps;Supine Hip  Flexion/Marching: AROM;Both;10 reps;Seated      Assessment/Plan    PT Assessment Patient needs continued PT services  PT Diagnosis Generalized weakness;Acute pain   PT Problem List Decreased activity tolerance;Decreased balance;Decreased strength;Decreased range of motion;Decreased knowledge of use of DME;Decreased safety awareness;Decreased mobility;Pain;Decreased knowledge of precautions  PT Treatment Interventions DME instruction;Gait training;Functional mobility training;Therapeutic activities;Therapeutic exercise;Balance training;Patient/family education   PT Goals (Current goals can be found in the Care Plan section) Acute Rehab PT Goals Patient Stated Goal: to go home PT Goal Formulation: With patient Time For Goal Achievement: 03/09/14 Potential to Achieve Goals: Good    Frequency 7X/week   Barriers to discharge Decreased caregiver support daughter has to go back to school  in a week    Co-evaluation               End of Session Equipment Utilized During Treatment: Gait belt Activity Tolerance: Patient limited by fatigue Patient left: in chair;with call bell/phone within reach;with family/visitor present Nurse Communication: Mobility status;Patient requests pain meds         Time: 1226-1256 PT Time Calculation (min): 30 min   Charges:   PT Evaluation $Initial PT Evaluation Tier I: 1 Procedure PT Treatments $Gait Training: 8-22 mins $Therapeutic Exercise: 8-22 mins   PT G Codes:          Kenzly Rogoff Dorene Ar Mar 09, 2014, 1:28 PM [facility] Acute Rehabilitation [phone] (pager)

## 2014-03-02 NOTE — Progress Notes (Signed)
Subjective:  Pt seen and examined in AM. No acute events overnight. Pt reports hip pain is controlled. No fever or chills.   Objective: Vital signs in last 24 hours: Filed Vitals:   03/02/14 1342 03/02/14 1344 03/02/14 1350 03/02/14 1500  BP: 88/25 76/17 90/38  100/40  Pulse: 92     Temp: 97.7 F (36.5 C)     TempSrc: Oral     Resp: 16     Height:      Weight:      SpO2: 94%      Weight change: 0.94 kg (2 lb 1.2 oz)  Intake/Output Summary (Last 24 hours) at 03/02/14 1621 Last data filed at 03/02/14 1300  Gross per 24 hour  Intake   1520 ml  Output    750 ml  Net    770 ml   PHYSICAL EXAM:  General: NAD Heart: Normal rate and rhythm   Lungs: Decreased breath sounds. No wheezing, ronchi, or rales Abdomen: Soft, non-distended, non-tender, normal BS Musculoskeletal: Cannot move right leg or hip due to pain Neuro: A & O x 3  Lab Results: Basic Metabolic Panel:  Recent Labs Lab 02/28/14 1319 03/02/14 0345  NA 143 144  K 4.5 5.3  CL 106 113*  CO2 24 26  GLUCOSE 135* 114*  BUN 20 27*  CREATININE 0.78 1.27*  CALCIUM 9.2 8.4   Liver Function Tests:  Recent Labs Lab 02/28/14 1319  AST 29  ALT 17  ALKPHOS 83  BILITOT 0.9  PROT 6.3  ALBUMIN 3.1*   CBC:  Recent Labs Lab 02/28/14 1319 03/01/14 0117 03/02/14 0345  WBC 14.7* 9.9 10.8*  NEUTROABS 13.4* 7.9*  --   HGB 13.8 13.1 9.6*  HCT 41.3 39.4 29.2*  MCV 92.8 92.5 93.3  PLT 161 130* 134*   Cardiac Enzymes:  Recent Labs Lab 02/28/14 1319 02/28/14 1928 03/01/14 0117  TROPONINI <0.30 <0.30 <0.30   Hemoglobin A1C:  Recent Labs Lab 03/01/14 0117  HGBA1C 5.6    Urinalysis:  Recent Labs Lab 02/28/14 1401  COLORURINE AMBER*  LABSPEC 1.042*  PHURINE 6.5  GLUCOSEU NEGATIVE  HGBUR NEGATIVE  BILIRUBINUR NEGATIVE  KETONESUR 15*  PROTEINUR >300*  UROBILINOGEN 1.0  NITRITE NEGATIVE  LEUKOCYTESUR SMALL*     Studies/Results: Dg Hip Operative Right  03/01/2014   CLINICAL DATA:   Postop  EXAM: DG OPERATIVE RIGHT HIP  TECHNIQUE: A single spot fluoroscopic AP image of the right hip is submitted.  COMPARISON:  02/28/2014  FINDINGS: Two views of the right hip submitted. There is right hip prosthesis in anatomic alignment.  IMPRESSION: Right hip prosthesis in anatomic alignment.   Electronically Signed   By: Lahoma Crocker M.D.   On: 03/01/2014 18:30   Medications: I have reviewed the patient's current medications. Scheduled Meds: . aspirin EC  325 mg Oral Q breakfast  . calcium carbonate  1,250 mg Oral Q breakfast  . cholecalciferol  800 Units Oral Daily  . enoxaparin (LOVENOX) injection  30 mg Subcutaneous Q24H  . multivitamin with minerals  1 tablet Oral Daily  . sodium chloride  1,000 mL Intravenous Once  . sodium chloride  3 mL Intravenous Q12H  . sodium chloride  3 mL Intravenous Q12H   Continuous Infusions: . sodium chloride 75 mL/hr at 03/02/14 0930   PRN Meds:.acetaminophen, acetaminophen, albuterol, bisacodyl, docusate sodium, HYDROcodone-acetaminophen, menthol-cetylpyridinium, morphine injection, oxyCODONE, phenol, promethazine, promethazine, senna, senna-docusate, sodium chloride Assessment/Plan:   Acute Nondisplaced Right Femoral Neck Fracture s/p anterior THR  on 5/13 - Etiology most likely from undiagnosed osteoporosis due to advanced age. -Appreciate orthopedic recommendations  -Norco/vicodin 1-2 Q 4hr pain -Oxycodone 5-10 mg Q 3 hr PRN  -IV morphine 1 mg Q 2 hr PRN  -Loxenox, ASA 325 mg daily, & SCD's for DVT prophylaxis  -F/U Vit 25 OH levels --> wnl (64) -Continue oscal 1250 mg daily & vitmain D 800 U daily -Consider outpatient DEXA scan & bisphosphonate therapy   -PT/OT once able to tolerate --> SNF, however per family think home better option  Hypotension in setting of recent syncope  - BP currently 100/40. Etiology most likely due hypovolemia and pain medications.  -Start NS 125 mL/hr -1 L NS bolus as needed  AKI - Cr 1.27, up from 0.78. Most  likely hypovolemia from hypotension.  -Start NS 125 mL/hr -1 L NS bolus as needed -Monitor BMP  Acute Anemia - Hg from 13.1 to 9.6 this AM with no active bleeding, however hypotensive s/p surgery (minimal blood loss per operative report)  -Monitor CBC -Transfuse Hg< 7 -Monitor for bleeding   Prolonged QT - 12-lead EKFG on 5/12 with QTc 503. -Avoid QT prolonging medications   Thrombocytopenia - Platelet count down from 161K, possibly due to hemodilution.  -Monitor CBC -Monitor in setting of heparin use  Possible Undiagnosed COPD - Pt denies respiratory symptoms with no smoking history. CXR on 5/12 with lungs that are mildly hyperinflated with attenuation of the pulmonary markings, compatible with COPD.  -Outpatient follow-up as needed  -Albuterol PRN   Pyuria without cystitis- Pt with UA on 5/12 with small LE, pyuria (7-10), and bacteruria (many). No leukocytosis.  -Monitor for symptoms  -No antibiotic therapy indicated at this time  Hyperglycemia - A1c 5.7 on 5/13   -Monitor for prediabetes  Code: Full  DVT Ppx: Lovenox daily, SCD's  Diet: Regular    Dispo: Disposition is deferred at this time, awaiting improvement of current medical problems.    The patient does not have a current PCP (No primary provider on file.) and does need an [facility] hospital follow-up appointment after discharge.  The patient does have transportation limitations that hinder transportation to clinic appointments.  .Services Needed at time of discharge: Y = Yes, Blank = No PT:   OT:   RN:   Equipment:   Other:     LOS: 2 days   Juluis Mire, MD 03/02/2014, 4:21 PM

## 2014-03-02 NOTE — Op Note (Signed)
NAMELYFE, REIHL                ACCOUNT NO.:  [id number]  MEDICAL RECORD NO.:  [id number]  LOCATION:  [id number]                        FACILITY:  [facility]  PHYSICIAN:  Feven Alderfer C. Lorin Mercy, M.D.    DATE OF BIRTH:  Jun 09, 1930  DATE OF PROCEDURE:  03/01/2014 DATE OF DISCHARGE:                              OPERATIVE REPORT   PREOPERATIVE DIAGNOSIS:  Displaced right femoral neck fracture.  POSTOPERATIVE DIAGNOSIS:  Displaced right femoral neck fracture.  PROCEDURE:  Right total hip arthroplasty.  SURGEON:  Arna Luis C. Lorin Mercy, M.D.  ASSISTANT:  Phillips Hay, PA-C; medically necessary and present for the entire procedure.  ANESTHESIA:  General.  ESTIMATED BLOOD LOSS:  Less than 100 mL.  INDICATIONS:  This is a 80 year old active female ambulates in the community, goes to the grocery store, drives, and had some pain in her hip, and while she is getting out of car felt sudden sharp increase in pain with femoral neck fracture noted at urgent care.  We discussed options of pinning of the hip versus total hip arthroplasty.  She elected to proceed with total hip arthroplasty, so she could be ambulatory sooner.  PROCEDURE IN DETAIL:  After induction of general anesthesia, the patient was placed on the [facility] table after having both knees carefully placed in the boots and wrapping Coban over a feet before the boot from the unna table was applied carefully.  C-arm was brought in and antibiotics were given.  Time-out procedure was completed.  The visualization of the hips were performed, localization, prepping with DuraPrep, split sheets, drapes, large shower curtain, Betadine, and Steri-Drape was applied. Incision was made based on palpable landmarks starting 2 cm distal and 2 cm lateral to the ASIS in oblique fashion directly over the palpable muscle.  The patient was thin.  She had osteopenia and relative osteoporosis with decreased bone density noted on plain radiographs. Fascia was split.  Skin  protector was applied with some difficulty as usual, rolled down to protect the skin.  Plain was identified, transverse vessels were coagulated.  The anterior capsule was removed. Cobra retractors were placed across the neck blade __________ was placed, and C-arm was brought in confirming appropriate level for the cut just below the level of the fracture, which left about a 4 mm calcar above the lesser trochanter.  The head was removed with a corkscrew. Acetabulum was prepared first.  She is very small individual and initial reamer was the 48 and just with mild pressure she rapidly reamed and there was a central defect with protrusio.  Out of the reamer, the bone graft was taken, packed into place, __________ slightly with a 49 and a 50 cup was inserted after putting the central bone graft in the central hole.  The anterior and posterior walls were still intact.  Superior dome was intact.  Screw was placed with some securement.  There was filling of the central hole with centralizer tightened down with screwdriver.  There was good stability of the cup, looked good on x-ray with abduction appropriate cup version.  Permanent poly was inserted in the preparation of the femur.  Femur was prepared to the small size and cookie cutter was used.  __________ followed by the 1st reamer, which was an 8 and that totally filled the canal and was tight fit.  The Corail stem was selected, impacted and once trial was placed as it was being reduced.  The acetabular shell shifted and went to a more vertical position, losing lateral coverage.  The broach was removed from the femur after the leg was taken down and under.  Hydraulic hook was removed each time coming out of the extension, adduction, and external rotation position.  With the broach removed, the liner had to be sacrificed to get back to the cup for an inspection.  It did not look quite screw dislodged, but the cup had rotated some on the screw  using a bone mallet impactor.  It was impacted in position, popped back into place, and then a 2nd screw was placed.  Third screw hole was too close with the protrusio and a even 15 mm screw originally placed, checked, looked like it violated through the inner cortex of the pelvis and then did not feel like it had good purchase, so it was removed with the 2 screws.  The cup was stable and a new liner was inserted.  Permanent #8 stem was inserted, +1 mm ball reduction.  With external rotation to 80 degrees, the hip would sublux out.  There was a trace shock.  The opposite hip had an unusual looking neck, we rotated the neck to check it, and no evidence of fracture.  She might had some old deformity to the hip, but in the event the neck on the opposite side was short and her legs appeared to be equal length.  With the protrusio and with the shorter calcar and a +1 ball, we would except to build little bit on the short side with comparison to the opposite hip and made it appear that it was actually 3-4 mm longer.  The femoral prosthesis filled the canal well.  The trochanter with curved __________ that had been around the trochanter felt thin.  There was small crack in the trochanter,  it was rotated checked on fluoroscopy, was in good position, but with finger pressure we would feel like it would be, but was not displaced.  The hip was rotated under fluoroscopy.  The stem looked good on AP and lateral. Documentation of leg lengths were made.  Pulse lavage repair of the anterior capsule had some additional stability.  V-Loc closure of the fascia, 2-0 on the subcutaneous tissue, then subcuticular closure, Dermabond, and once this was dry 4x4s and tape.  The patient was transferred to recovery room in stable condition.  Instrument count and needle count was correct.     Emran Molzahn C. Lorin Mercy, M.D.     MCY/MEDQ  D:  03/01/2014  T:  03/02/2014  Job:  [id number]

## 2014-03-02 NOTE — Progress Notes (Signed)
Pt bp 90/28 manuel, pt asymptomatic. Pt attempted to void, unable to bladder scan resulted 274cc. Pt states she doent want to hav i/o cath yet. She want to try on her own. Called Dr. Naaman Plummer, orders to give 1000 cc bolus and rescan bladder after pt attempts again tonight.monitoring will continue.

## 2014-03-02 NOTE — Clinical Documentation Improvement (Signed)
THIS DOCUMENT IS NOT A PERMANENT PART OF THE MEDICAL RECORD  PN 5/14 states "Hgb drop". Patient is 1 day postop THA. Please clarify the term "Hgb drop" to reflect severity of illness and risk of mortality. Thank you.  Possible Clinical Conditions? - Expected acute blood loss anemia - Precipitous drop in hematocrit  - Acute on chronic blood loss anemia  - Acute blood loss anemia  - Other condition (please specify)   Supporting Information: - Postop THA - H/H: 5/12 13.8/39.4,  5/13 13.1/39.4,  5/14 9.6/29.2 - monitoring H/H - NS IV 75 ml/hr x1 day  You may use possible, probable, or suspect with inpatient documentation. Possible, probable, suspected diagnoses MUST be documented at the time of discharge.  Reviewed: additional documentation in the medical record  Thank Dennis Bast  [place] Documentation Specialist: [place]  Thanks , done put in progress note.

## 2014-03-02 NOTE — Progress Notes (Signed)
Subjective: 1 Day Post-Op Procedure(s) (LRB): TOTAL HIP ARTHROPLASTY ANTERIOR APPROACH (Right) Patient reports pain as moderate.    Objective: Vital signs in last 24 hours: Temp:  [97.6 F (36.4 C)-98.6 F (37 C)] 97.6 F (36.4 C) (05/14 0353) Pulse Rate:  [75-97] 91 (05/14 0353) Resp:  [12-26] 17 (05/14 0353) BP: (99-136)/(29-36) 104/29 mmHg (05/14 0353) SpO2:  [96 %-100 %] 99 % (05/14 0353) Weight:  [46.3 kg (102 lb 1.2 oz)] 46.3 kg (102 lb 1.2 oz) (05/14 0353)  Intake/Output from previous day: 05/13 0701 - 05/14 0700 In: 1860 [P.O.:360; I.V.:1400] Out: 750 [Urine:550; Blood:200] Intake/Output this shift:     Recent Labs  02/28/14 1319 03/01/14 0117 03/02/14 0345  HGB 13.8 13.1 9.6*    Recent Labs  03/01/14 0117 03/02/14 0345  WBC 9.9 10.8*  RBC 4.26 3.13*  HCT 39.4 29.2*  PLT 130* 134*    Recent Labs  02/28/14 1319 03/02/14 0345  NA 143 144  K 4.5 5.3  CL 106 113*  CO2 24 26  BUN 20 27*  CREATININE 0.78 1.27*  GLUCOSE 135* 114*  CALCIUM 9.2 8.4   No results found for this basename: LABPT, INR,  in the last 72 hours  Neurologically intact  Assessment/Plan: 1 Day Post-Op Procedure(s) (LRB): TOTAL HIP ARTHROPLASTY ANTERIOR APPROACH (Right) Up with therapy   Hgb drop, will recheck in AM.    Marybelle Killings 03/02/2014, 8:15 AM

## 2014-03-02 NOTE — Progress Notes (Signed)
  I have seen and examined the patient, and reviewed the daily progress note by Jori Moll L. Richardson Chiquito, [place] and discussed the care of the patient with them. Please see my progress note from 03/02/2014 for further details regarding assessment and plan.    Signed:  Juluis Mire, MD 03/02/2014, 9:02 PM

## 2014-03-02 NOTE — Progress Notes (Signed)
ANTICOAGULATION CONSULT NOTE - Initial Consult  Pharmacy Consult for Lovenox Indication: VTE prophylaxis   Allergies  Allergen Reactions  . Penicillins Other (See Comments)    Reaction: blisters on leg    Patient Measurements: Height: 5\' 2"  (157.5 cm) Weight: 102 lb 1.2 oz (46.3 kg) IBW/kg (Calculated) : 50.1  Vital Signs: Temp: 97.6 F (36.4 C) (05/14 0353) Temp src: Oral (05/14 1342) BP: 104/29 mmHg (05/14 0353) Pulse Rate: 92 (05/14 1342)  Labs:  Recent Labs  02/28/14 1319 02/28/14 1928 03/01/14 0117 03/02/14 0345  HGB 13.8  --  13.1 9.6*  HCT 41.3  --  39.4 29.2*  PLT 161  --  130* 134*  CREATININE 0.78  --   --  1.27*  TROPONINI <0.30 <0.30 <0.30  --     Estimated Creatinine Clearance: 25.4 ml/min (by C-G formula based on Cr of 1.27).  Assessment:  POD#1 right THA.Admitted 5/12 pm with hip fracture.   Lovenox 40 mg sq x 1 given on 5/12 pm.  EC ASA 325 mg daily + SCDs ordered post-op on 5/13.  Now to add Lovenox for VTE prophylaxis.  CBC has trended down. No bleeding noted.  BUN/creatinine have trended up.  Hydrating.  Only 46.3 kg.  Goal of Therapy:  appopriate Lovenox dose for VTE prophylaxis Monitor platelets by anticoagulation protocol: Yes   Plan:   Lovenox 30 mg SQ q24hrs.  Will follow renal function and CBC for any need to adjust.  Arty Baumgartner, [place] Pager: [phone] 03/02/2014,1:43 PM

## 2014-03-02 NOTE — Progress Notes (Signed)
Pt foley catheter removed at 0630 this am. Pt has not voided. Pt attempted to void on BSC unable to void. Bladder scan resulted 150cc-200cc. Pt is not uncomfortable.bp 88/30's. Orders to give 1000ccbolus. Monitoring will continue.

## 2014-03-02 NOTE — Progress Notes (Signed)
    Day 2 of stay      Patient name: Michele Johns  Medical record number: [id number]  Date of birth: 09/12/1930   Met with patient this morning on rounds. She is doing well after surgery and offers no new complaints. Her exam is significant for expected pain around surgical site - right hip joint.   Pertinent Labs  Recent Labs Lab 02/28/14 1319 03/02/14 0345  BUN 20 27*  CREATININE 0.78 1.27*    Assessment and Plan   POD1 post THR - We continue to monitor and follow orthopedic recommendations. Doing well with PT. Pain well controlled on fentanyl injection and morphine as needed. On Lovenox.   AKI - We will do a repeat BMP after 12 hours of IV fluids.  Osteoporosis - For now on Calcium and Vitamin D. Defer bisphosphonates discussion to a less acute setting.  Disposition - Family wants to take home. 24 hour care available per family.   I have discussed the care of this patient with my IM team residents. Please see the resident note for details.  Anntionette Madkins 03/02/2014, 1:41 PM.

## 2014-03-02 NOTE — Progress Notes (Signed)
Subjective:  Pt seen and examined in AM. No acute events post-op or overnight. During the morning exam, patient reported a dull pain at an 8/10. Says that sometimes the pain gets bad enough that it causes her to heave. She reports having normal bladder control with normal urination. No bowel movement since surgery.   Objective: Vital signs in last 24 hours: Filed Vitals:   03/01/14 1915 03/01/14 1930 03/01/14 1948 03/02/14 0353  BP: 109/30 106/33 99/34 104/29  Pulse: 84 82 83 91  Temp:  97.8 F (36.6 C) 97.6 F (36.4 C) 97.6 F (36.4 C)  TempSrc:   Oral Oral  Resp: 13 12 17 17   Height:      Weight:    46.3 kg (102 lb 1.2 oz)  SpO2: 99% 100% 100% 99%   Weight change: 0.94 kg (2 lb 1.2 oz)  Intake/Output Summary (Last 24 hours) at 03/02/14 1340 Last data filed at 03/02/14 0600  Gross per 24 hour  Intake   1500 ml  Output    750 ml  Net    750 ml   PHYSICAL EXAM:  General: NAD Heart: Normal rate and rhythm . Asymptomatic systolic murmur heart at left sternal border. Lungs: Decreased breath sounds. No wheezing, ronchi, or rales Abdomen: Soft, non-distended, non-tender, normal BS Musculoskeletal: Cannot move right leg or hip due to pain Neuro: A & O x 3  Lab Results: Basic Metabolic Panel:  Recent Labs Lab 02/28/14 1319 03/02/14 0345  NA 143 144  K 4.5 5.3  CL 106 113*  CO2 24 26  GLUCOSE 135* 114*  BUN 20 27*  CREATININE 0.78 1.27*  CALCIUM 9.2 8.4   Liver Function Tests:  Recent Labs Lab 02/28/14 1319  AST 29  ALT 17  ALKPHOS 83  BILITOT 0.9  PROT 6.3  ALBUMIN 3.1*   CBC:  Recent Labs Lab 02/28/14 1319 03/01/14 0117 03/02/14 0345  WBC 14.7* 9.9 10.8*  NEUTROABS 13.4* 7.9*  --   HGB 13.8 13.1 9.6*  HCT 41.3 39.4 29.2*  MCV 92.8 92.5 93.3  PLT 161 130* 134*   Cardiac Enzymes:  Recent Labs Lab 02/28/14 1319 02/28/14 1928 03/01/14 0117  TROPONINI <0.30 <0.30 <0.30   Hemoglobin A1C:  Recent Labs Lab 03/01/14 0117  HGBA1C 5.6     Urinalysis:  Recent Labs Lab 02/28/14 1401  COLORURINE AMBER*  LABSPEC 1.042*  PHURINE 6.5  GLUCOSEU NEGATIVE  HGBUR NEGATIVE  BILIRUBINUR NEGATIVE  KETONESUR 15*  PROTEINUR >300*  UROBILINOGEN 1.0  NITRITE NEGATIVE  LEUKOCYTESUR SMALL*     Studies/Results: Dg Chest 2 View  02/28/2014   CLINICAL DATA:  Syncope  EXAM: CHEST  2 VIEW  COMPARISON:  None.  FINDINGS: The cardiac and mediastinal silhouettes are within normal limits. Atherosclerotic calcifications noted within the aortic arch.  The lungs are mildly hyperinflated with attenuation of the pulmonary markings, compatible with COPD. Marland Kitchen No airspace consolidation, pleural effusion, or pulmonary edema is identified. There is no pneumothorax.  No acute osseous abnormality identified.  IMPRESSION: 1. No acute cardiopulmonary abnormality. 2. COPD.   Electronically Signed   By: Jeannine Boga M.D.   On: 02/28/2014 14:23   Dg Hip Complete Right  02/28/2014   CLINICAL DATA:  Right anterior groin pain radiating distally. Limited range of motion.  EXAM: RIGHT HIP - COMPLETE 2+ VIEW  COMPARISON:  None available.  FINDINGS: There is an acute nondisplaced fracture through the right femoral neck with slight superior subluxation. The femoral head remains normally  aligned within the acetabulum. Femoral head height is preserved. Limited views of the left hip are unremarkable. No pubic dye stasis. SI joints are approximated. Bony pelvis is intact.  Diffuse osteopenia noted. Degenerative changes present within the lower lumbar spine.  IMPRESSION: Acute nondisplaced fracture through the right femoral neck.   Electronically Signed   By: Jeannine Boga M.D.   On: 02/28/2014 14:25   Dg Hip Operative Right  03/01/2014   CLINICAL DATA:  Postop  EXAM: DG OPERATIVE RIGHT HIP  TECHNIQUE: A single spot fluoroscopic AP image of the right hip is submitted.  COMPARISON:  02/28/2014  FINDINGS: Two views of the right hip submitted. There is right hip  prosthesis in anatomic alignment.  IMPRESSION: Right hip prosthesis in anatomic alignment.   Electronically Signed   By: Lahoma Crocker M.D.   On: 03/01/2014 18:30   Medications: I have reviewed the patient's current medications. Scheduled Meds: . aspirin EC  325 mg Oral Q breakfast  . calcium carbonate  1,250 mg Oral Q breakfast  . cholecalciferol  800 Units Oral Daily  . enoxaparin (LOVENOX) injection  30 mg Subcutaneous Q24H  . multivitamin with minerals  1 tablet Oral Daily  . sodium chloride  3 mL Intravenous Q12H  . sodium chloride  3 mL Intravenous Q12H   Continuous Infusions: . sodium chloride     PRN Meds:.acetaminophen, acetaminophen, albuterol, bisacodyl, docusate sodium, fentaNYL, HYDROcodone-acetaminophen, menthol-cetylpyridinium, morphine injection, oxyCODONE, phenol, promethazine, promethazine, senna, senna-docusate, sodium chloride Assessment/Plan:   Acute Nondisplaced Right Femoral Neck Fracture s/p anterior THR on 5/13 - Etiology most likely from undiagnosed osteoporosis due to advanced age. -Appreciate orthopedic recommendations  -Norco/vicodin 1-2 Q 4hr pain  -Restarted Loxenox & SCD's for DVT prophylaxis  -F/U Vit 25 OH levels  -Start oscal 1250 mg daily & vitmain D 800 U daily -Consider outpatient DEXA scan & bisphosphonate therapy   -PT/OT once able to tolerate   Prolonged QT - 12-lead EKFG on 5/12 with QTc 503. -Avoid QT prolonging medications   Thrombocytopenia - Platelet count 130K, down from 161K, possibly due to hemodilution.  -Monitor CBC -Monitor in setting of heparin use  Pyuria without cystitis- Pt with UA on 5/12 with small LE, pyuria (7-10), and bacteruria (many). No leukocytosis.  -Monitor for symptoms  -No antibiotic therapy indicated at this time  Hyperglycemia - A1c 5.7 on 5/13   -Monitor for prediabetes  Increased creatinine - Creatinine increased from 0.78 on 5/12 to 1.27 on 05/14 labs. Most likely do to dehydrated state. - Ordered a  repeat BMP after starting on 49mL/h maintenance fluid. - Monitor BMP tomorrow morning  Code: Full  DVT Ppx: Lovenox daily, SCD's  Diet: Regular    Dispo: Disposition is deferred at this time, awaiting improvement of current medical problems.    The patient does not have a current PCP (No primary provider on file.) and does need an [facility] hospital follow-up appointment after discharge.  The patient does have transportation limitations that hinder transportation to clinic appointments.  .Services Needed at time of discharge: Y = Yes, Blank = No PT: y  OT: y  RN:   Equipment:   Other:     LOS: 2 days   Michele Johns, Med Student 03/02/2014, 1:40 PM

## 2014-03-03 ENCOUNTER — Encounter (HOSPITAL_COMMUNITY): Payer: Self-pay | Admitting: Orthopaedic Surgery

## 2014-03-03 DIAGNOSIS — D649 Anemia, unspecified: Secondary | ICD-10-CM

## 2014-03-03 DIAGNOSIS — N179 Acute kidney failure, unspecified: Secondary | ICD-10-CM

## 2014-03-03 DIAGNOSIS — I959 Hypotension, unspecified: Secondary | ICD-10-CM

## 2014-03-03 DIAGNOSIS — R339 Retention of urine, unspecified: Secondary | ICD-10-CM

## 2014-03-03 DIAGNOSIS — R82998 Other abnormal findings in urine: Secondary | ICD-10-CM

## 2014-03-03 LAB — BASIC METABOLIC PANEL
BUN: 26 mg/dL — AB (ref 6–23)
CHLORIDE: 104 meq/L (ref 96–112)
CO2: 25 mEq/L (ref 19–32)
CREATININE: 1.11 mg/dL — AB (ref 0.50–1.10)
Calcium: 8.2 mg/dL — ABNORMAL LOW (ref 8.4–10.5)
GFR calc Af Amer: 53 mL/min — ABNORMAL LOW (ref >90)
GFR calc non Af Amer: 45 mL/min — ABNORMAL LOW (ref >90)
GLUCOSE: 131 mg/dL — AB (ref 70–99)
Potassium: 5 mEq/L (ref 3.7–5.3)
Sodium: 136 mEq/L — ABNORMAL LOW (ref 137–147)

## 2014-03-03 LAB — CBC
HEMATOCRIT: 22.4 % — AB (ref 36.0–46.0)
HEMOGLOBIN: 7.7 g/dL — AB (ref 12.0–15.0)
MCH: 31.6 pg (ref 26.0–34.0)
MCHC: 34.4 g/dL (ref 30.0–36.0)
MCV: 91.8 fL (ref 78.0–100.0)
Platelets: 115 10*3/uL — ABNORMAL LOW (ref 150–400)
RBC: 2.44 MIL/uL — AB (ref 3.87–5.11)
RDW: 14 % (ref 11.5–15.5)
WBC: 9.9 10*3/uL (ref 4.0–10.5)

## 2014-03-03 MED ORDER — SODIUM CHLORIDE 0.9 % IV SOLN
INTRAVENOUS | Status: DC
Start: 2014-03-03 — End: 2014-03-04
  Administered 2014-03-03: 14:00:00 via INTRAVENOUS

## 2014-03-03 MED ORDER — SODIUM CHLORIDE 0.9 % IV BOLUS (SEPSIS)
500.0000 mL | Freq: Once | INTRAVENOUS | Status: AC
  Administered 2014-03-03: 500 mL via INTRAVENOUS

## 2014-03-03 MED ORDER — ENSURE COMPLETE PO LIQD
237.0000 mL | Freq: Every day | ORAL | Status: DC
  Administered 2014-03-03 – 2014-03-07 (×3): 237 mL via ORAL

## 2014-03-03 NOTE — Progress Notes (Signed)
Pt voiding very small amounts very frequently.  Bladder scan reveals 530-540cc in bladder.  Pt slightly distended with some discomfort.  I&O cath performed.  375 out.  Post bladder scan 171cc.  Will continue to monitor pt and report to day shift RN.  Call bell within reach.

## 2014-03-03 NOTE — Care Management Note (Signed)
    Page 1 of 1   03/03/2014     1:06:50 PM CARE MANAGEMENT NOTE 03/03/2014  Patient:  [facility]   Account Number:  [id number]  Date Initiated:  03/03/2014  Documentation initiated by:  [facility]  Subjective/Objective Assessment:   adm: R hip fx; R TOTAL HIP ARTHROPLASTY ANTERIOR APPROACH     Action/Plan:   discharge planning SNF for rehab   Anticipated DC Date:  03/04/2014   Anticipated DC Plan:  [place]  In-house referral  Clinical Social Worker      [place]  CM consult  PCP issues      Choice offered to / List presented to:             Status of service:  Completed, signed off Medicare Important Message given?   (If response is "NO", the following Medicare IM given date fields will be blank) Date Medicare IM given:   Date Additional Medicare IM given:    Discharge Disposition:  [place]  Per UR Regulation:    If discussed at Long Length of Stay Meetings, dates discussed:    Comments:  03/03/14 12:55 CM met with pt and gave pt a resource list to secure a PCP.  Pt states she is willing to go to a SNF for rehab.  Pt states her daughter, Olin Hauser [phone] will decide which SNF is appropriate.  CM called and spoke with Olin Hauser to let her know a PCP resource list was left with her mother and Olin Hauser states she has already been in contact with the CSW concerning placement for rehab.  No other CM needs were communicated.  Mariane Masters, BSN, CM [phone].

## 2014-03-03 NOTE — Progress Notes (Signed)
  I have seen and examined the patient, and reviewed the daily progress note by Jori Moll L. Richardson Chiquito, [place] and discussed the care of the patient with them. Please see my progress note from 03/03/2014 for further details regarding assessment and plan.    Signed:  Juluis Mire, MD 03/03/2014, 6:25 PM

## 2014-03-03 NOTE — Progress Notes (Signed)
    Day 3 of stay      Patient name: Michele Johns  Medical record number: [id number]  Date of birth: 28-May-1930   Patient improving doing much better. Had some low blood pressures yesterday but improved with fluids. I met with patient and evaluated her today. Her exam is similar to previous exam, she endorses less tenderness in the surgical site. Bandage is dry and the site is clean. We continue to work with ortho and PT/OT do decide on disposition.   I have discussed the care of this patient with my IM team residents. Please see the resident note for details.  Ayme Short 03/03/2014, 2:23 PM.

## 2014-03-03 NOTE — Progress Notes (Signed)
Clinical Social Work Department BRIEF PSYCHOSOCIAL ASSESSMENT 03/03/2014  Patient:  Michele Johns     Account Number:  [id number]     Admit date:  02/28/2014  Clinical Social Worker:  Freeman Caldron  Date/Time:  03/03/2014 01:58 PM  Referred by:  Physician  Date Referred:  03/03/2014 Referred for  SNF Placement   Other Referral:   Interview type:  Family Other interview type:    PSYCHOSOCIAL DATA Living Status:  ALONE Admitted from facility:   Level of care:   Primary support name:  Winfield Cunas ([phone]) Primary support relationship to patient:  CHILD, ADULT Degree of support available:   Good--pt lives in [place] independently, has support from daughter.    CURRENT CONCERNS Current Concerns  Post-Acute Placement   Other Concerns:    SOCIAL WORK ASSESSMENT / PLAN CSW called pt's daughter, who understands PT recommendation. Per PT eval, daughter and pt agreeable to SNF for rehab. Daughter did not have preference and CSW made referrals to all SNFs in [facility]. Will follow up with pt/daughter once facilities respond.   Assessment/plan status:  Psychosocial Support/Ongoing Assessment of Needs Other assessment/ plan:   Information/referral to community resources:   SNF    PATIENT'S/FAMILY'S RESPONSE TO PLAN OF CARE: Good--daughter participated in conversation with CSW. Understands CSW role--CSW to follow up with pt/daughter once facilities respond to bed request.       Ky Barban, MSW, [facility] Clinical Social Worker [phone]

## 2014-03-03 NOTE — Progress Notes (Addendum)
Clinical Social Work Department CLINICAL SOCIAL WORK PLACEMENT NOTE 03/03/2014  Patient:  [facility]  Account Number:  [id number] Admit date:  02/28/2014  Clinical Social Worker:  Ky Barban, Latanya Presser  Date/time:  03/03/2014 01:57 PM  Clinical Social Work is seeking post-discharge placement for this patient at the following level of care:   [place]   (*CSW will update this form in Epic as items are completed)   N/A-clinicals sent to all SNFs in [facility]  Patient/family provided with [place] Department of Clinical Social Work's list of facilities offering this level of care within the geographic area requested by the patient (or if unable, by the patient's family).  03/03/2014  Patient/family informed of their freedom to choose among providers that offer the needed level of care, that participate in Medicare, Medicaid or managed care program needed by the patient, have an available bed and are willing to accept the patient.  N/A-not sent to this facility  Patient/family informed of [facility]' ownership interest in [facility], as well as of the fact that they are under no obligation to receive care at this facility.  PASARR submitted to EDS on 03/03/2014 PASARR number received from EDS on 03/03/2014  FL2 transmitted to all facilities in geographic area requested by pt/family on  03/03/2014 FL2 transmitted to all facilities within larger geographic area on   Patient informed that his/her managed care company has contracts with or will negotiate with  certain facilities, including the following:     Patient/family informed of bed offers received:  03/03/14 Patient chooses bed at  Physician recommends and patient chooses bed at    Patient to be transferred to  on   Patient to be transferred to facility by   The following physician request were entered in Epic:   Additional Comments:   Ky Barban, MSW, [place] Social  Worker [phone]

## 2014-03-03 NOTE — Progress Notes (Signed)
Pt voided 100cc, post void residual result 278, made dr. Ronne Binning aware, no new orders. Monitoring will continue.

## 2014-03-03 NOTE — Progress Notes (Signed)
Subjective:  Pt seen and examined in AM. Pt overnight with reported fall, however per pt she did not. Blood pressure improved after fluid bolus and IVF's. Reports hip pain is controlled and was able to walk with PT without a lot of difficulty. No fever or chills. Pt with some post-op urinary retention after surgery requiring I & O catherization.   Objective: Vital signs in last 24 hours: Filed Vitals:   03/02/14 2358 03/03/14 0100 03/03/14 0336 03/03/14 0432  BP:  98/31  110/35  Pulse:  103  101  Temp:    98.6 F (37 C)  TempSrc:    Oral  Resp: 17 18 17 20   Height:      Weight:    50.4 kg (111 lb 1.8 oz)  SpO2: 95% 95% 95% 93%   Weight change: 4.1 kg (9 lb 0.6 oz)  Intake/Output Summary (Last 24 hours) at 03/03/14 1146 Last data filed at 03/03/14 0756  Gross per 24 hour  Intake    120 ml  Output    871 ml  Net   -751 ml   PHYSICAL EXAM:  General: NAD Heart: Normal rate and rhythm. Systolic murmur present  Lungs: Decreased breath sounds. No wheezing, ronchi, or rales Abdomen: Soft, non-distended, non-tender, normal BS Musculoskeletal: Limited movement of right leg and hip due to pain Neuro: A & O x 3  Lab Results: Basic Metabolic Panel:  Recent Labs Lab 03/02/14 1650 03/03/14 0314  NA 138 136*  K 4.2 5.0  CL 102 104  CO2 22 25  GLUCOSE 142* 131*  BUN 28* 26*  CREATININE 1.22* 1.11*  CALCIUM 8.3* 8.2*   Liver Function Tests:  Recent Labs Lab 02/28/14 1319  AST 29  ALT 17  ALKPHOS 83  BILITOT 0.9  PROT 6.3  ALBUMIN 3.1*   CBC:  Recent Labs Lab 02/28/14 1319 03/01/14 0117 03/02/14 0345 03/03/14 0314  WBC 14.7* 9.9 10.8* 9.9  NEUTROABS 13.4* 7.9*  --   --   HGB 13.8 13.1 9.6* 7.7*  HCT 41.3 39.4 29.2* 22.4*  MCV 92.8 92.5 93.3 91.8  PLT 161 130* 134* 115*   Cardiac Enzymes:  Recent Labs Lab 02/28/14 1319 02/28/14 1928 03/01/14 0117  TROPONINI <0.30 <0.30 <0.30   Hemoglobin A1C:  Recent Labs Lab 03/01/14 0117  HGBA1C 5.6      Urinalysis:  Recent Labs Lab 02/28/14 1401  COLORURINE AMBER*  LABSPEC 1.042*  PHURINE 6.5  GLUCOSEU NEGATIVE  HGBUR NEGATIVE  BILIRUBINUR NEGATIVE  KETONESUR 15*  PROTEINUR >300*  UROBILINOGEN 1.0  NITRITE NEGATIVE  LEUKOCYTESUR SMALL*     Studies/Results: Dg Hip Operative Right  03/01/2014   CLINICAL DATA:  Postop  EXAM: DG OPERATIVE RIGHT HIP  TECHNIQUE: A single spot fluoroscopic AP image of the right hip is submitted.  COMPARISON:  02/28/2014  FINDINGS: Two views of the right hip submitted. There is right hip prosthesis in anatomic alignment.  IMPRESSION: Right hip prosthesis in anatomic alignment.   Electronically Signed   By: Lahoma Crocker M.D.   On: 03/01/2014 18:30   Medications: I have reviewed the patient's current medications. Scheduled Meds: . aspirin EC  325 mg Oral Q breakfast  . calcium carbonate  1,250 mg Oral Q breakfast  . cholecalciferol  800 Units Oral Daily  . multivitamin with minerals  1 tablet Oral Daily  . sodium chloride  3 mL Intravenous Q12H  . sodium chloride  3 mL Intravenous Q12H   Continuous Infusions: .  sodium chloride     PRN Meds:.acetaminophen, acetaminophen, albuterol, bisacodyl, docusate sodium, HYDROcodone-acetaminophen, menthol-cetylpyridinium, morphine injection, oxyCODONE, phenol, promethazine, promethazine, senna, senna-docusate, sodium chloride Assessment/Plan:   Acute Nondisplaced Right Femoral Neck Fracture s/p anterior THR on 5/13 - Etiology most likely from undiagnosed osteoporosis due to advanced age. Vitamin 25 OH level normal (64).  -Appreciate orthopedic recommendations  -Norco/vicodin 1-2 Q 4hr pain -Oxycodone 5-10 mg Q 3 hr PRN  -IV morphine 1 mg Q 2 hr PRN  -D/C Loxenox - ASA 325 mg daily for 4 weeks & SCD's for DVT prophylaxis  -Continue oscal 1250 mg daily & vitmain D 800 U daily -Consider outpatient DEXA scan & bisphosphonate therapy   -Up with therapy, 50% partial weight bearing. -PT/OT once able to  tolerate --> SNF placement -Orthopedic follow-up in 2 weeks with Dr. Lorin Mercy  -Ice packs PRN pain  -Wound care daily/PRN, covered for 2 weeks  -CM consult for finding PCP   Hypotension in setting of recent syncope - improved s/p 2 L NS bolus and 125 mL/ hr NS. BP currently 110/35. Etiology most likely due hypovolemia and pain medications.  -NS 50 mL/hr -1 L NS bolus as needed  Nonoliguric AKI  -Improved s/p IVF's. Cr 1.11, up from 0.78. Most likely prerenal azotemia from hypovolemia from hypotension.  -NS 50 mL/hr -1 L NS bolus as needed -Monitor BMP -Bladder scan with I & O catherization PRN (>300cc)  Acute Anemia - Hg from 13.8 on admission to 7.7 this AM with no active bleeding s/p surgery (minimal blood loss per operative report). Most likely due to femoral neck fracture. -Monitor CBC -Transfuse Hg< 7 -Monitor for bleeding   Prolonged QT - 12-lead EKFG on 5/12 with QTc 503. -Avoid QT prolonging medications   Thrombocytopenia - Platelet count down to 115K from 161K on admission with no active bleeding.  -Monitor CBC -Monitor in setting of heparin use  Possible Undiagnosed COPD - Pt denies respiratory symptoms with no smoking history. CXR on 5/12 with lungs that are mildly hyperinflated with attenuation of the pulmonary markings, compatible with COPD.  -Outpatient follow-up as needed  -Albuterol PRN   Pyuria without cystitis- Pt with UA on 5/12 with small LE, pyuria (7-10), and bacteruria (many). No leukocytosis.  -Monitor for symptoms  -No antibiotic therapy indicated at this time  Hyperglycemia - A1c 5.7 on 5/13   -Monitor for prediabetes  Code: Full  DVT Ppx: ASA 325 mg, SCD's  Diet: Regular    Dispo: 1 day    The patient does not have a current PCP (No primary provider on file.) and does need an [facility] hospital follow-up appointment after discharge.  The patient does have transportation limitations that hinder transportation to clinic appointments.  .Services Needed  at time of discharge: Y = Yes, Blank = No PT:   OT:   RN:   Equipment:  Rolling [place] with 5" wheels  Other:     LOS: 3 days   Juluis Mire, MD 03/03/2014, 11:46 AM

## 2014-03-03 NOTE — Progress Notes (Signed)
Pt voided 120cc, post void residual 255cc. Pt denies discomfort, monitoring will continue.

## 2014-03-03 NOTE — Progress Notes (Signed)
Patient ID: Michele Johns, female   DOB: Dec 10, 1929, 80 y.o.   MRN: [id number]   On SCD and ASA for DVT prophylaxis.  Does not need lovenox.  I and O cath times one for  Post op  urinary retention.  Hip better each day.

## 2014-03-03 NOTE — Progress Notes (Signed)
03/03/14 0100  What Happened  Was fall witnessed? No  Was patient injured? No  Patient found on floor  Found by Staff-comment  Stated prior activity to/from bed, chair, or stretcher  Follow Up  MD notified Heber Aynor  Time MD notified 51  Family notified No- patient refusal  Additional tests No  Progress note created (see row info) Yes  Adult Fall Risk Assessment  Risk Factor Category (scoring not indicated) High fall risk per protocol (document High fall risk)  Age 80  Fall History: Fall within 6 months prior to admission 5  Elimination; Bowel and/or Urine Incontinence 0  Elimination; Bowel and/or Urine Urgency/Frequency 0  Medications: includes PCA/Opiates, Anti-convulsants, Anti-hypertensives, Diuretics, Hypnotics, Laxatives, Sedatives, and Psychotropics 5  Patient Care Equipment 1  Mobility-Assistance 2  Mobility-Gait 0  Mobility-Sensory Deficit 0  Cognition-Awareness 0  Cognition-Impulsiveness 0  Cognition-Limitations 0  Total Score 16  Patient's Fall Risk High Fall Risk (>13 points)  Adult Fall Risk Interventions  Required Bundle Interventions *See Row Information* High fall risk - low, moderate, and high requirements implemented  Additional Interventions Fall risk signage;Individualized elimination schedule;Secure all tubes/drains;Use of appropriate toileting equipment (bedpan, BSC, etc.)  Vitals  BP ! 98/31 mmHg  BP Location Right arm  BP Method Automatic  Patient Position (if appropriate) Lying  Pulse Rate ! 103  Pulse Rate Source Dinamap  ECG Heart Rate ! 108  Cardiac Rhythm NSR  New onset of dysrhythmia? No  Resp 18  Oxygen Therapy  SpO2 95 %  O2 Device None (Room air)  Pain Assessment  Pain Score 0  PCA/Epidural/Spinal Assessment  Respiratory Pattern Regular;Unlabored;Symmetrical  Neurological  Neuro (WDL) WDL  Level of Consciousness Alert  Orientation Level Oriented X4  RLE Motor Response Responds to commands  RLE Sensation No numbness;No tingling   Musculoskeletal  Musculoskeletal (WDL) X  Assistive Device BSC  Generalized Weakness Yes  Weight Bearing Restrictions Yes  RLE Weight Bearing PWB  RLE Partial Weight Bearing Percentage or Pounds 50%  Musculoskeletal Details  RUE Full movement  LUE Full movement  RLE Limited movement  Right Hip Limited movement  Integumentary  Integumentary (WDL) X  Skin Color Appropriate for ethnicity  Skin Condition Dry  Skin Integrity Surgical Incision (see LDA)  Skin Turgor Non-tenting    Went to pts room, found pt on knees in front of BSC.  Pt stated she had got to the end of the bed to grab BSC from the wall and attempted to get on it.  Pt states 0/10 pain, Vitals consistent with previous.  No skin issues.  MD aware and to bedside, no orders received.

## 2014-03-03 NOTE — Progress Notes (Signed)
Subjective: 2 Days Post-Op Procedure(s) (LRB): TOTAL HIP ARTHROPLASTY ANTERIOR APPROACH (Right) Patient reports pain as mild.    Objective: Vital signs in last 24 hours: Temp:  [97.7 F (36.5 C)-98.6 F (37 C)] 98.6 F (37 C) (05/15 0432) Pulse Rate:  [92-103] 101 (05/15 0432) Resp:  [16-20] 20 (05/15 0432) BP: (76-110)/(17-40) 110/35 mmHg (05/15 0432) SpO2:  [93 %-96 %] 93 % (05/15 0432) Weight:  [50.4 kg (111 lb 1.8 oz)] 50.4 kg (111 lb 1.8 oz) (05/15 0432)  Intake/Output from previous day: 05/14 0701 - 05/15 0700 In: 120 [P.O.:120] Out: 325 [Urine:325] Intake/Output this shift: Total I/O In: -  Out: 546 [Urine:546]   Recent Labs  02/28/14 1319 03/01/14 0117 03/02/14 0345 03/03/14 0314  HGB 13.8 13.1 9.6* 7.7*    Recent Labs  03/02/14 0345 03/03/14 0314  WBC 10.8* 9.9  RBC 3.13* 2.44*  HCT 29.2* 22.4*  PLT 134* 115*    Recent Labs  03/02/14 1650 03/03/14 0314  NA 138 136*  K 4.2 5.0  CL 102 104  CO2 22 25  BUN 28* 26*  CREATININE 1.22* 1.11*  GLUCOSE 142* 131*  CALCIUM 8.3* 8.2*   No results found for this basename: LABPT, INR,  in the last 72 hours  Neurovascular intact Sensation intact distally Dorsiflexion/Plantar flexion intact Incision: dressing C/D/I  Assessment/Plan: 2 Days Post-Op Procedure(s) (LRB): TOTAL HIP ARTHROPLASTY ANTERIOR APPROACH (Right) Up with therapy Disposition planning for home vs SNF.  Family prefers home.  Will see how she progresses with activity Aspirin for VTE 50% partial weight bearing. Anterior total hip precautions. vicodin for pain.  rx on chart. OV 2 weeks with DR Lorin Mercy Dressing change daily or as needed.  Keep wound covered for 2 weeks Ice packs as needed for pain and swelling.  Epimenio Foot 03/03/2014, 10:44 AM

## 2014-03-03 NOTE — Progress Notes (Signed)
Patient ID: Michele Johns, female   DOB: [birth year], 80 y.o.   MRN: [id number]  change in Hgb  Acute blood loss anemia secondary to femoral neck fracture .

## 2014-03-03 NOTE — Progress Notes (Signed)
INITIAL NUTRITION ASSESSMENT  DOCUMENTATION CODES Per approved criteria  -Underweight   INTERVENTION: Ensure Complete po daily, each supplement provides 350 kcal and 13 grams of protein RD to follow for nutrition care plan  NUTRITION DIAGNOSIS: Increased nutrient needs related to post-op healing as evidenced by estimated nutrition needs  Goal: Pt to meet >/= 90% of their estimated nutrition needs   Monitor:  PO & supplemental intake, weight, labs, I/O's  Reason for Assessment: BMI < 18.5   80 y.o. female  Admitting Dx: Syncope  ASSESSMENT: 80 year old female with no significant past medical history who presents with right leg/hip pain and episode of LOC; pre-operative diagnosis: right femoral neck fracture.  Patient s/p procedure 5/13: TOTAL HIP ARTHROPLASTY ANTERIOR APPROACH (Right)  Pt reports her appetite is doing ok; states she's "not a big eater;" usually consumes 2 meals per day and a snack between breakfast and lunch; reports wt stability; she reports she's going to start drinking Ensure supplements when she goes home; amenable to trying during hospitatlization; RD to order.  Dietary recall: Breakfast: eggs or oatmeal Snack: yogurt or cereal Dinner: meat, starch, veggie  Nutrition focused physical exam completed.  No muscle or subcutaneous fat depletion noticed.  Height: Ht Readings from Last 1 Encounters:  02/28/14 5\' 2"  (1.575 m)    Weight: Wt Readings from Last 1 Encounters:  03/03/14 111 lb 1.8 oz (50.4 kg)    Ideal Body Weight: 110 lb  % Ideal Body Weight: 101%  Wt Readings from Last 10 Encounters:  03/03/14 111 lb 1.8 oz (50.4 kg)  03/03/14 111 lb 1.8 oz (50.4 kg)    Usual Body Weight: unknown  % Usual Body Weight: ---  BMI:  Body mass index is 20.32 kg/(m^2).  Estimated Nutritional Needs: Kcal: 1200-1400 Protein: 60-70 gm Fluid: >/= 1.5 L  Skin: surgical hip incision   Diet Order: General  EDUCATION NEEDS: -No education needs  identified at this time   Intake/Output Summary (Last 24 hours) at 03/03/14 1213 Last data filed at 03/03/14 0756  Gross per 24 hour  Intake    120 ml  Output    871 ml  Net   -751 ml    Labs:   Recent Labs Lab 03/02/14 0345 03/02/14 1650 03/03/14 0314  NA 144 138 136*  K 5.3 4.2 5.0  CL 113* 102 104  CO2 26 22 25   BUN 27* 28* 26*  CREATININE 1.27* 1.22* 1.11*  CALCIUM 8.4 8.3* 8.2*  GLUCOSE 114* 142* 131*     Scheduled Meds: . aspirin EC  325 mg Oral Q breakfast  . calcium carbonate  1,250 mg Oral Q breakfast  . cholecalciferol  800 Units Oral Daily  . multivitamin with minerals  1 tablet Oral Daily  . sodium chloride  3 mL Intravenous Q12H  . sodium chloride  3 mL Intravenous Q12H    Continuous Infusions: . sodium chloride      History reviewed. No pertinent past medical history.  History reviewed. No pertinent past surgical history.  Arthur Holms, RD, LDN Pager #: [phone] After-Hours Pager #: [phone]

## 2014-03-03 NOTE — Progress Notes (Signed)
Subjective:  Pt seen and examined in AM. There was a report from nursing saying that she fell the previous night. When we spoke to the patient this morning she reported that she was making an attempt at going to the bedside commode but then felt weakness in her lower extremities and sat down on the floor where she called the nurses for help. She explained that the reason she had not called the nurses prior to standing up because she felt like she "had bothered" the nurses enough throughout the day.  She received a bladder scan earlier toda that showed a volume of 530 - 540cc. I&O cath performed - 375ml out. of Pt reports hip pain is well controlled. No fever or chills.   Objective: Vital signs in last 24 hours: Filed Vitals:   03/02/14 2358 03/03/14 0100 03/03/14 0336 03/03/14 0432  BP:  98/31  110/35  Pulse:  103  101  Temp:    98.6 F (37 C)  TempSrc:    Oral  Resp: 17 18 17 20   Height:      Weight:    50.4 kg (111 lb 1.8 oz)  SpO2: 95% 95% 95% 93%   Weight change: 4.1 kg (9 lb 0.6 oz)  Intake/Output Summary (Last 24 hours) at 03/03/14 1148 Last data filed at 03/03/14 0756  Gross per 24 hour  Intake    120 ml  Output    871 ml  Net   -751 ml   PHYSICAL EXAM:  General: NAD Heart: Normal rate and rhythm   Lungs: Decreased breath sounds. No wheezing, ronchi, or rales Abdomen: Soft, non-distended, non-tender, normal BS Musculoskeletal: Cannot move right leg or hip due to pain Neuro: A & O x 3  Lab Results: Basic Metabolic Panel:  Recent Labs Lab 03/02/14 1650 03/03/14 0314  NA 138 136*  K 4.2 5.0  CL 102 104  CO2 22 25  GLUCOSE 142* 131*  BUN 28* 26*  CREATININE 1.22* 1.11*  CALCIUM 8.3* 8.2*   Liver Function Tests:  Recent Labs Lab 02/28/14 1319  AST 29  ALT 17  ALKPHOS 83  BILITOT 0.9  PROT 6.3  ALBUMIN 3.1*   CBC:  Recent Labs Lab 02/28/14 1319 03/01/14 0117 03/02/14 0345 03/03/14 0314  WBC 14.7* 9.9 10.8* 9.9  NEUTROABS 13.4* 7.9*  --    --   HGB 13.8 13.1 9.6* 7.7*  HCT 41.3 39.4 29.2* 22.4*  MCV 92.8 92.5 93.3 91.8  PLT 161 130* 134* 115*   Cardiac Enzymes:  Recent Labs Lab 02/28/14 1319 02/28/14 1928 03/01/14 0117  TROPONINI <0.30 <0.30 <0.30   Hemoglobin A1C:  Recent Labs Lab 03/01/14 0117  HGBA1C 5.6    Urinalysis:  Recent Labs Lab 02/28/14 1401  COLORURINE AMBER*  LABSPEC 1.042*  PHURINE 6.5  GLUCOSEU NEGATIVE  HGBUR NEGATIVE  BILIRUBINUR NEGATIVE  KETONESUR 15*  PROTEINUR >300*  UROBILINOGEN 1.0  NITRITE NEGATIVE  LEUKOCYTESUR SMALL*     Studies/Results: Dg Hip Operative Right  03/01/2014   CLINICAL DATA:  Postop  EXAM: DG OPERATIVE RIGHT HIP  TECHNIQUE: A single spot fluoroscopic AP image of the right hip is submitted.  COMPARISON:  02/28/2014  FINDINGS: Two views of the right hip submitted. There is right hip prosthesis in anatomic alignment.  IMPRESSION: Right hip prosthesis in anatomic alignment.   Electronically Signed   By: Lahoma Crocker M.D.   On: 03/01/2014 18:30   Medications: I have reviewed the patient's current medications. Scheduled Meds: .  aspirin EC  325 mg Oral Q breakfast  . calcium carbonate  1,250 mg Oral Q breakfast  . cholecalciferol  800 Units Oral Daily  . multivitamin with minerals  1 tablet Oral Daily  . sodium chloride  3 mL Intravenous Q12H  . sodium chloride  3 mL Intravenous Q12H   Continuous Infusions: . sodium chloride     PRN Meds:.acetaminophen, acetaminophen, albuterol, bisacodyl, docusate sodium, HYDROcodone-acetaminophen, menthol-cetylpyridinium, morphine injection, oxyCODONE, phenol, promethazine, promethazine, senna, senna-docusate, sodium chloride Assessment/Plan:   Acute Nondisplaced Right Femoral Neck Fracture s/p anterior THR on 5/13 - Etiology most likely from undiagnosed osteoporosis due to advanced age. -Appreciate orthopedic recommendations  -Norco/vicodin 1-2 Q 4hr pain -Oxycodone 5-10 mg Q 3 hr PRN  -IV morphine 1 mg Q 2 hr PRN  -  ASA 325 mg daily, & SCD's for DVT prophylaxis. Loxenox d/c by ortho -F/U Vit 25 OH levels --> wnl (64) -Continue oscal 1250 mg daily & vitmain D 800 U daily -Consider outpatient DEXA scan & bisphosphonate therapy   -PT/OT once able to tolerate --> SNF, however per family think home better option  Hypotension in setting of recent syncope  - BP currently 100/40. Etiology most likely due hypovolemia and pain medications.  - Decrease NS 125 mL/hr to 50 mL/hr. -1 L NS bolus as needed  AKI - Cr 1.27, up from 0.78. Most likely hypovolemia from hypotension.  - Cr trending down at 1.11 this morning. - NS  50 mL/hr maintanence fluid -1 L NS bolus as needed -Monitor BMP  Urinary retention - Bladder scan revealed a volume of 530-540cc after complaints of uncomfortable bladder.  -  I&O cath performed which had an output of 375. - Monitor urine output   Acute Anemia - Hg from 13.1 to 9.6 this AM with no active bleeding, however hypotensive s/p surgery (minimal blood loss per operative report)  -Monitor CBC -Transfuse Hg< 7 -Monitor for bleeding   Prolonged QT - 12-lead EKFG on 5/12 with QTc 503. -Avoid QT prolonging medications   Thrombocytopenia - Platelet count down from 161K, possibly due to hemodilution.  -Monitor CBC -Monitor in setting of heparin use  Pyuria without cystitis- Pt with UA on 5/12 with small LE, pyuria (7-10), and bacteruria (many). No leukocytosis.  -Monitor for symptoms  -No antibiotic therapy indicated at this time  Hyperglycemia - A1c 5.7 on 5/13   -Monitor for prediabetes  Code: Full  DVT Ppx: Lovenox daily, SCD's  Diet: Regular    Dispo: Disposition is deferred at this time, awaiting improvement of current medical problems.    The patient does not have a current PCP (No primary provider on file.) and does need an [facility] hospital follow-up appointment after discharge.  The patient does have transportation limitations that hinder transportation to clinic  appointments.  .Services Needed at time of discharge: Y = Yes, Blank = No PT: Y  OT:   RN:   Equipment:   Other:     LOS: 3 days   Judithe Modest, Med Student 03/03/2014, 11:48 AM

## 2014-03-03 NOTE — Progress Notes (Signed)
Physical Therapy Treatment Patient Details Name: Michele Johns MRN: [id number] DOB: 05-01-1930 Today's Date: 03/03/2014    History of Present Illness Pt adm with rt femoral neck fx and syncopal episode. Pt underwent Rt. THA with anterior approach.    PT Comments    Pt making steady progress. Continues to need assist with all mobility. [place] last night trying to get up on her own.  Follow Up Recommendations  SNF     Equipment Recommendations  Rolling [place] with 5" wheels    Recommendations for Other Services       Precautions / Restrictions Precautions Precautions: Fall;Anterior Hip Restrictions Weight Bearing Restrictions: Yes RLE Weight Bearing: Partial weight bearing RLE Partial Weight Bearing Percentage or Pounds: 50%    Mobility  Bed Mobility Overal bed mobility: Needs Assistance Bed Mobility: Supine to Sit     Supine to sit: Mod assist;HOB elevated     General bed mobility comments: assist to bring legs over and elevate trunk.  Transfers Overall transfer level: Needs assistance Equipment used: Rolling [place] (2 wheeled) Transfers: Sit to/from Stand Sit to Stand: Min assist         General transfer comment: Verbal/tactile cues for hand placement. Assist to bring hips up and for balance.  Ambulation/Gait Ambulation/Gait assistance: Min assist;Mod assist;+2 safety/equipment Ambulation Distance (Feet): 30 Feet Assistive device: Rolling [place] (2 wheeled) Gait Pattern/deviations: Step-to pattern;Decreased step length - right;Decreased step length - left;Decreased stance time - right;Antalgic;Trunk flexed;Narrow base of support   Gait velocity interpretation: Below normal speed for age/gender General Gait Details: Frequent verbal/tactile cues for gait sequence and to stay closer to Kari.   Stairs            Wheelchair Mobility    Modified Rankin (Stroke Patients Only)       Balance Overall balance assessment: Needs  assistance Sitting-balance support: Feet supported Sitting balance-Leahy Scale: Fair     Standing balance support: Bilateral upper extremity supported Standing balance-Leahy Scale: Poor Standing balance comment: [place] and min A for support                    Cognition       Area of Impairment: Problem solving;Safety/judgement;Memory     Memory: Decreased short-term memory   Safety/Judgement: Decreased awareness of safety   Problem Solving: Requires verbal cues;Requires tactile cues General Comments: Pt with great difficulty sequencing gait.    Exercises Total Joint Exercises Ankle Circles/Pumps: AROM;Both;10 reps;Seated Long Arc Quad: AROM;10 reps;Seated    General Comments        Pertinent Vitals/Pain Soreness rt hip.    Home Living                      Prior Function            PT Goals (current goals can now be found in the care plan section) Progress towards PT goals: Progressing toward goals    Frequency  Min 3X/week    PT Plan Frequency needs to be updated    Co-evaluation             End of Session Equipment Utilized During Treatment: Gait belt Activity Tolerance: Patient limited by fatigue Patient left: in chair;with call bell/phone within reach;with family/visitor present;with chair alarm set     Time: 8756-4332 PT Time Calculation (min): 23 min  Charges:  $Gait Training: 8-22 mins $Therapeutic Exercise: 8-22 mins  G Codes:      Guin 03/03/2014, 10:02 AM  Suanne Marker PT [phone]

## 2014-03-04 ENCOUNTER — Inpatient Hospital Stay (HOSPITAL_COMMUNITY): Payer: Medicare Other

## 2014-03-04 LAB — CBC WITH DIFFERENTIAL/PLATELET
BASOS PCT: 0 % (ref 0–1)
Basophils Absolute: 0 10*3/uL (ref 0.0–0.1)
EOS ABS: 0.1 10*3/uL (ref 0.0–0.7)
Eosinophils Relative: 2 % (ref 0–5)
HCT: 21.7 % — ABNORMAL LOW (ref 36.0–46.0)
HEMOGLOBIN: 7.3 g/dL — AB (ref 12.0–15.0)
LYMPHS ABS: 0.5 10*3/uL — AB (ref 0.7–4.0)
Lymphocytes Relative: 6 % — ABNORMAL LOW (ref 12–46)
MCH: 31.1 pg (ref 26.0–34.0)
MCHC: 33.6 g/dL (ref 30.0–36.0)
MCV: 92.3 fL (ref 78.0–100.0)
MONOS PCT: 9 % (ref 3–12)
Monocytes Absolute: 0.8 10*3/uL (ref 0.1–1.0)
Neutro Abs: 7.1 10*3/uL (ref 1.7–7.7)
Neutrophils Relative %: 83 % — ABNORMAL HIGH (ref 43–77)
PLATELETS: 136 10*3/uL — AB (ref 150–400)
RBC: 2.35 MIL/uL — AB (ref 3.87–5.11)
RDW: 14.6 % (ref 11.5–15.5)
WBC: 8.6 10*3/uL (ref 4.0–10.5)

## 2014-03-04 LAB — BASIC METABOLIC PANEL
BUN: 22 mg/dL (ref 6–23)
CO2: 20 meq/L (ref 19–32)
Calcium: 8.8 mg/dL (ref 8.4–10.5)
Chloride: 108 mEq/L (ref 96–112)
Creatinine, Ser: 0.86 mg/dL (ref 0.50–1.10)
GFR calc Af Amer: 71 mL/min — ABNORMAL LOW (ref >90)
GFR, EST NON AFRICAN AMERICAN: 62 mL/min — AB (ref >90)
GLUCOSE: 112 mg/dL — AB (ref 70–99)
Potassium: 4.2 mEq/L (ref 3.7–5.3)
Sodium: 139 mEq/L (ref 137–147)

## 2014-03-04 LAB — D-DIMER, QUANTITATIVE (NOT AT ARMC): D DIMER QUANT: 1.9 ug{FEU}/mL — AB (ref 0.00–0.48)

## 2014-03-04 MED ORDER — ALBUTEROL SULFATE (2.5 MG/3ML) 0.083% IN NEBU: 5.0000 mg | INHALATION_SOLUTION | RESPIRATORY_TRACT | Status: DC | PRN

## 2014-03-04 MED ORDER — IPRATROPIUM-ALBUTEROL 0.5-2.5 (3) MG/3ML IN SOLN
3.0000 mL | Freq: Four times a day (QID) | RESPIRATORY_TRACT | Status: DC
  Administered 2014-03-04 – 2014-03-05 (×3): 3 mL via RESPIRATORY_TRACT
  Filled 2014-03-04 (×4): qty 3

## 2014-03-04 MED ORDER — IOHEXOL 350 MG/ML SOLN
80.0000 mL | Freq: Once | INTRAVENOUS | Status: AC | PRN
  Administered 2014-03-04: 60 mL via INTRAVENOUS

## 2014-03-04 MED ORDER — ACETAMINOPHEN 325 MG PO TABS
325.0000 mg | ORAL_TABLET | Freq: Three times a day (TID) | ORAL | Status: DC | PRN
  Filled 2014-03-04: qty 1

## 2014-03-04 NOTE — Progress Notes (Signed)
Patient stable Walk in the room yesterday Ankle dorsiflexion plantarflexion intact on the right Leg lengths approximately equal TB to mobilize with PT

## 2014-03-04 NOTE — Progress Notes (Signed)
  I have seen and examined the patient, and reviewed the daily progress note by Jori Moll L. Richardson [place], [place] and discussed the care of the patient with them. Please see my progress note from 03/04/2014 for further details regarding assessment and plan.    Signed:  Juluis Mire, MD 03/04/2014, 8:10 PM

## 2014-03-04 NOTE — Progress Notes (Signed)
Subjective:  Pt seen and examined in AM. Pt reports this morning felt warm and then became short of breath with coughing and wheezing. Pt then with hypoxia (87% on RA) requiring oxygen supplementation.  Reports improvement of wheezing after breathing treatment. Blood pressure improved after IVF's. No leg pain or swelling. Still having hip pain but able to ambulate with PT. Daughter searching for SNF placement.    Objective: Vital signs in last 24 hours: Filed Vitals:   03/03/14 2026 03/04/14 0000 03/04/14 0400 03/04/14 0416  BP: 101/34   125/42  Pulse: 93   104  Temp: 98.2 F (36.8 C)   98.9 F (37.2 C)  TempSrc: Oral   Oral  Resp:  17 17   Height:      Weight:      SpO2: 94% 95% 95% 87%   Weight change:   Intake/Output Summary (Last 24 hours) at 03/04/14 0756 Last data filed at 03/03/14 1941  Gross per 24 hour  Intake    220 ml  Output    370 ml  Net   -150 ml   PHYSICAL EXAM:  General: NAD Heart: Normal rate and rhythm. Systolic murmur present  Lungs:Breathing comfortably on 2L .  Decreased breath sounds. Mild wheezing, ronchi, or rales Abdomen: Soft, non-distended, non-tender, normal BS Musculoskeletal: Limited movement of right leg and hip due to pain, no swelling.  Neuro: A & O x 3  Lab Results: Basic Metabolic Panel:  Recent Labs Lab 03/03/14 0314 03/04/14 0322  NA 136* 139  K 5.0 4.2  CL 104 108  CO2 25 20  GLUCOSE 131* 112*  BUN 26* 22  CREATININE 1.11* 0.86  CALCIUM 8.2* 8.8   Liver Function Tests:  Recent Labs Lab 02/28/14 1319  AST 29  ALT 17  ALKPHOS 83  BILITOT 0.9  PROT 6.3  ALBUMIN 3.1*   CBC:  Recent Labs Lab 03/01/14 0117  03/03/14 0314 03/04/14 0322  WBC 9.9  < > 9.9 8.6  NEUTROABS 7.9*  --   --  7.1  HGB 13.1  < > 7.7* 7.3*  HCT 39.4  < > 22.4* 21.7*  MCV 92.5  < > 91.8 92.3  PLT 130*  < > 115* 136*  < > = values in this interval not displayed. Cardiac Enzymes:  Recent Labs Lab 02/28/14 1319  02/28/14 1928 03/01/14 0117  TROPONINI <0.30 <0.30 <0.30   Hemoglobin A1C:  Recent Labs Lab 03/01/14 0117  HGBA1C 5.6    Urinalysis:  Recent Labs Lab 02/28/14 1401  COLORURINE AMBER*  LABSPEC 1.042*  PHURINE 6.5  GLUCOSEU NEGATIVE  HGBUR NEGATIVE  BILIRUBINUR NEGATIVE  KETONESUR 15*  PROTEINUR >300*  UROBILINOGEN 1.0  NITRITE NEGATIVE  LEUKOCYTESUR SMALL*     Studies/Results: No results found. Medications: I have reviewed the patient's current medications. Scheduled Meds: . aspirin EC  325 mg Oral Q breakfast  . calcium carbonate  1,250 mg Oral Q breakfast  . cholecalciferol  800 Units Oral Daily  . feeding supplement (ENSURE COMPLETE)  237 mL Oral Q1500  . multivitamin with minerals  1 tablet Oral Daily  . sodium chloride  3 mL Intravenous Q12H  . sodium chloride  3 mL Intravenous Q12H   Continuous Infusions:   PRN Meds:.acetaminophen, acetaminophen, albuterol, bisacodyl, docusate sodium, HYDROcodone-acetaminophen, menthol-cetylpyridinium, morphine injection, phenol, promethazine, promethazine, senna, senna-docusate, sodium chloride Assessment/Plan:   Dyspnea, Hypoxia, and Sinus Tachycardia  - Currently on 2L O2 with 95% SpO2 and HR 110. Symptoms  concerning for PE in setting of recent syncope and hypotension. Moderate risk per Well's score. -Oxygen supplementation to keep SpO2 > 92% -Duonebs Q 6 hr -Albuterol Q 4 hr PRN  -Obtain 12-lead EKG  -Obtain 2-view CXR -Obtain d-dimer -Consider CTA chest to r/o PE, renal function normal now -DVT ppx per orthopedics, ASA 325 mg and SCD's  Acute Nondisplaced Right Femoral Neck Fracture s/p anterior THR on 5/13 - Etiology most likely from undiagnosed osteoporosis due to advanced age. Vitamin 25 OH level normal (64).  -Appreciate orthopedic recommendations  -Norco/vicodin 1-2 Q 4hr pain - ASA 325 mg daily for 4 weeks & SCD's for DVT prophylaxis  -Continue oscal 1250 mg daily & vitmain D 800 U daily -Consider  outpatient DEXA scan & bisphosphonate therapy   -Up with therapy, 50% partial weight bearing. -Orthopedic follow-up in 2 weeks with Dr. Lorin Mercy  -Ice packs PRN pain  -Wound care daily/PRN, to be covered for 2 weeks  -SNF once medically stable   Hypotension in setting of recent syncope - Resolved.  Etiology could be due to PE vs pain-induced  -D/C NS 50 mL/hr -1 L NS bolus as needed  Nonoliguric AKI  -Resolved. s/p IVF's. Cr 0.86. Most likely prerenal azotemia from hypovolemia from hypotension.  -D/C NS 50 mL/hr -1 L NS bolus as needed -Monitor BMP -Bladder scan with I & O catherization PRN (>300cc)  Acute Anemia - Hg from 13.8 on admission to 7.3 this AM with no active bleeding s/p surgery (minimal blood loss per operative report). Most likely due to femoral neck fracture. -Monitor CBC -Transfuse Hg< 7 -Monitor for bleeding   Prolonged QT - 12-lead EKFG on 5/12 with QTc 503. -Avoid QT prolonging medications   Thrombocytopenia - Improved. Platelet count 136 K from 161K on admission with no active bleeding.  -Monitor CBC -Monitor for bleeding  Possible Undiagnosed COPD -. CXR on 5/12 with lungs that are mildly hyperinflated with attenuation of the pulmonary markings, compatible with COPD. Pt past smoker (2 yrs) and second hand exposure. -Oxygen supplementation to keep SpO2 > 92% -Duonebs Q 6 hr -Outpatient PFT's  Pyuria without cystitis- Pt with UA on 5/12 with small LE, pyuria (7-10), and bacteruria (many). No leukocytosis.  -Monitor for symptoms  -No antibiotic therapy indicated at this time  Hyperglycemia - A1c 5.7 on 5/13   -Monitor for prediabetes  Code: Full  DVT Ppx: ASA 325 mg, SCD's  Diet: Regular    Dispo:   The patient does not have a current PCP (No primary provider on file.) and does need an [facility] hospital follow-up appointment after discharge.  The patient does have transportation limitations that hinder transportation to clinic appointments.  .Services  Needed at time of discharge: Y = Yes, Blank = No PT:   OT:   RN:   Equipment:  Rolling [place] with 5" wheels  Other:     LOS: 4 days   Juluis Mire, MD 03/04/2014, 7:56 AM

## 2014-03-04 NOTE — Progress Notes (Signed)
Subjective:  No acute events overnight. Patient seen and examined in AM. Patient reports that  this morning she felt the room was about 80 degrees and she was sweating on the back of her neck. During this, she started feeling SOB with coughing and wheezing. Patient's O2 saturation measured at 87% on RA which subsequently required oxygen supplementation. Reports improvement of wheezing after breathing treatment.  No reported leg pain or swelling. She did not sleep with the SCD's and were not on this morning. Hip pain has been tolerable when immobile. Daughter searching for SNF placement.    Objective: Vital signs in last 24 hours: Filed Vitals:   03/04/14 0800 03/04/14 0925 03/04/14 1200 03/04/14 1457  BP:      Pulse:      Temp:      TempSrc:      Resp: 18  18   Height:      Weight:      SpO2: 94% 86%  98%   Weight change:   Intake/Output Summary (Last 24 hours) at 03/04/14 1508 Last data filed at 03/04/14 1327  Gross per 24 hour  Intake    340 ml  Output    220 ml  Net    120 ml   PHYSICAL EXAM:  General: NAD Heart: Tachycardic with normal rhythm. Systolic murmur present  Lungs:Breathing comfortably on 2L [place].  Decreased breath sounds. Decreased Lung sounds at the base. Abdomen: Soft, non-distended, non-tender, normal BS Musculoskeletal: Limited movement of right leg and hip due to pain, no swelling.  Neuro: A & O x 3  Lab Results: Basic Metabolic Panel:  Recent Labs Lab 03/03/14 0314 03/04/14 0322  NA 136* 139  K 5.0 4.2  CL 104 108  CO2 25 20  GLUCOSE 131* 112*  BUN 26* 22  CREATININE 1.11* 0.86  CALCIUM 8.2* 8.8   Liver Function Tests:  Recent Labs Lab 02/28/14 1319  AST 29  ALT 17  ALKPHOS 83  BILITOT 0.9  PROT 6.3  ALBUMIN 3.1*   CBC:  Recent Labs Lab 03/01/14 0117  03/03/14 0314 03/04/14 0322  WBC 9.9  < > 9.9 8.6  NEUTROABS 7.9*  --   --  7.1  HGB 13.1  < > 7.7* 7.3*  HCT 39.4  < > 22.4* 21.7*  MCV 92.5  < > 91.8 92.3  PLT 130*  < >  115* 136*  < > = values in this interval not displayed. Cardiac Enzymes:  Recent Labs Lab 02/28/14 1319 02/28/14 1928 03/01/14 0117  TROPONINI <0.30 <0.30 <0.30   Hemoglobin A1C:  Recent Labs Lab 03/01/14 0117  HGBA1C 5.6    Urinalysis:  Recent Labs Lab 02/28/14 1401  COLORURINE AMBER*  LABSPEC 1.042*  PHURINE 6.5  GLUCOSEU NEGATIVE  HGBUR NEGATIVE  BILIRUBINUR NEGATIVE  KETONESUR 15*  PROTEINUR >300*  UROBILINOGEN 1.0  NITRITE NEGATIVE  LEUKOCYTESUR SMALL*     Studies/Results: No results found. Medications: I have reviewed the patient's current medications. Scheduled Meds: . aspirin EC  325 mg Oral Q breakfast  . calcium carbonate  1,250 mg Oral Q breakfast  . cholecalciferol  800 Units Oral Daily  . feeding supplement (ENSURE COMPLETE)  237 mL Oral Q1500  . ipratropium-albuterol  3 mL Nebulization Q6H  . multivitamin with minerals  1 tablet Oral Daily  . sodium chloride  3 mL Intravenous Q12H  . sodium chloride  3 mL Intravenous Q12H   Continuous Infusions:   PRN Meds:.acetaminophen, albuterol, bisacodyl, docusate sodium, HYDROcodone-acetaminophen, menthol-cetylpyridinium,  phenol, promethazine, promethazine, senna, senna-docusate, sodium chloride Assessment/Plan:   Dyspnea, Hypoxia, and Sinus Tachycardia  - Currently on 2L O2 with 95% SpO2 and HR 110. Symptoms concerning for PE in setting of recent orthopedic surgery and immobility.  Moderate risk per Well's score ( Well's score = 3) -Oxygen supplementation to keep SpO2 > 92% -Duonebs Q 6 hr -Albuterol Q 4 hr PRN  - 12-lead EKG  - unchanged from prior EKG -D-dimer elevated but most likely due to recent surgery - Ordered CTA chest to r/o PE, renal function normal now -DVT ppx per orthopedics, ASA 325 mg and SCD's  Acute Nondisplaced Right Femoral Neck Fracture s/p anterior THR on 5/13 - Etiology most likely from undiagnosed osteoporosis due to advanced age. Vitamin 25 OH level normal (64).   -Appreciate orthopedic recommendations  -Norco/vicodin 1-2 Q 4hr pain - ASA 325 mg daily for 4 weeks & SCD's for DVT prophylaxis  -Continue oscal 1250 mg daily & vitmain D 800 U daily -Consider outpatient DEXA scan & bisphosphonate therapy   -Up with therapy, 50% partial weight bearing. -Orthopedic follow-up in 2 weeks with Dr. Lorin Mercy  -Ice packs PRN pain  -Wound care daily/PRN, to be covered for 2 weeks  -SNF once medically stable   Hypotension in setting of recent syncope - Resolved.  Etiology could be due to PE vs pain-induced  -D/C NS 50 mL/hr -1 L NS bolus as needed  Nonoliguric AKI  -Resolved. s/p IVF's. Cr 0.86. Most likely prerenal azotemia from hypovolemia from hypotension.  -D/C NS 50 mL/hr -1 L NS bolus as needed -Monitor BMP -Bladder scan with I & O catherization PRN (>300cc)  Acute Anemia - Hg from 13.8 on admission to 7.3 this AM with no active bleeding s/p surgery (minimal blood loss per operative report). Most likely due to femoral neck fracture. -Monitor CBC -Transfuse Hg< 7 -Monitor for bleeding   Prolonged QT - 12-lead EKFG on 5/12 with QTc 503. -Avoid QT prolonging medications   Thrombocytopenia - Improved. Platelet count 136 K from 161K on admission with no active bleeding.  -Monitor CBC -Monitor for bleeding  Possible Undiagnosed COPD -. CXR on 5/12 with lungs that are mildly hyperinflated with attenuation of the pulmonary markings, compatible with COPD. Pt past smoker (2 yrs) and second hand exposure. -Oxygen supplementation to keep SpO2 > 92% -Duonebs Q 6 hr -Outpatient PFT's  Pyuria without cystitis- Pt with UA on 5/12 with small LE, pyuria (7-10), and bacteruria (many). No leukocytosis.  -Monitor for symptoms  -No antibiotic therapy indicated at this time  Hyperglycemia - A1c 5.7 on 5/13   -Monitor for prediabetes  Code: Full  DVT Ppx: ASA 325 mg, SCD's  Diet: Regular    Dispo:   The patient does not have a current PCP (No primary  provider on file.) and does need an [facility] hospital follow-up appointment after discharge.  The patient does have transportation limitations that hinder transportation to clinic appointments.  .Services Needed at time of discharge: Y = Yes, Blank = No PT:   OT:   RN:   Equipment:  Rolling [place] with 5" wheels  Other:     LOS: 4 days   Judithe Modest, Med Student 03/04/2014, 3:08 PM

## 2014-03-04 NOTE — Progress Notes (Signed)
Pt voided in bed, post void residual 389cc.  Pt attempted to void with  100 cc out.  Post void scan residual 286cc.  Will continue to monitor pt closely. Call bell within reach.

## 2014-03-05 ENCOUNTER — Inpatient Hospital Stay (HOSPITAL_COMMUNITY): Payer: Medicare Other

## 2014-03-05 DIAGNOSIS — R0609 Other forms of dyspnea: Secondary | ICD-10-CM

## 2014-03-05 DIAGNOSIS — R000 Tachycardia, unspecified: Secondary | ICD-10-CM

## 2014-03-05 DIAGNOSIS — R0989 Other specified symptoms and signs involving the circulatory and respiratory systems: Secondary | ICD-10-CM

## 2014-03-05 DIAGNOSIS — R0902 Hypoxemia: Secondary | ICD-10-CM

## 2014-03-05 LAB — ALBUMIN, FLUID (OTHER): Albumin, Fluid: 0.5 g/dL

## 2014-03-05 LAB — ABO/RH: ABO/RH(D): O POS

## 2014-03-05 LAB — BASIC METABOLIC PANEL
BUN: 16 mg/dL (ref 6–23)
CHLORIDE: 107 meq/L (ref 96–112)
CO2: 22 meq/L (ref 19–32)
Calcium: 8.8 mg/dL (ref 8.4–10.5)
Creatinine, Ser: 0.75 mg/dL (ref 0.50–1.10)
GFR calc non Af Amer: 77 mL/min — ABNORMAL LOW (ref >90)
GFR, EST AFRICAN AMERICAN: 90 mL/min — AB (ref >90)
Glucose, Bld: 102 mg/dL — ABNORMAL HIGH (ref 70–99)
POTASSIUM: 4.5 meq/L (ref 3.7–5.3)
SODIUM: 139 meq/L (ref 137–147)

## 2014-03-05 LAB — FERRITIN: Ferritin: 127 ng/mL (ref 10–291)

## 2014-03-05 LAB — BODY FLUID CELL COUNT WITH DIFFERENTIAL
Lymphs, Fluid: 8 %
Monocyte-Macrophage-Serous Fluid: 32 % — ABNORMAL LOW (ref 50–90)
Neutrophil Count, Fluid: 60 % — ABNORMAL HIGH (ref 0–25)
Total Nucleated Cell Count, Fluid: 77 cu mm (ref 0–1000)

## 2014-03-05 LAB — PROTEIN, BODY FLUID: TOTAL PROTEIN, FLUID: 0.7 g/dL

## 2014-03-05 LAB — IRON AND TIBC
Iron: 14 ug/dL — ABNORMAL LOW (ref 42–135)
SATURATION RATIOS: 8 % — AB (ref 20–55)
TIBC: 166 ug/dL — ABNORMAL LOW (ref 250–470)
UIBC: 152 ug/dL (ref 125–400)

## 2014-03-05 LAB — LACTATE DEHYDROGENASE, PLEURAL OR PERITONEAL FLUID: LD, Fluid: 58 U/L — ABNORMAL HIGH (ref 3–23)

## 2014-03-05 LAB — RETICULOCYTES
RBC.: 2.34 MIL/uL — AB (ref 3.87–5.11)
RETIC COUNT ABSOLUTE: 60.8 10*3/uL (ref 19.0–186.0)
RETIC CT PCT: 2.6 % (ref 0.4–3.1)

## 2014-03-05 LAB — VITAMIN B12: VITAMIN B 12: 1978 pg/mL — AB (ref 211–911)

## 2014-03-05 LAB — CBC
HCT: 20.7 % — ABNORMAL LOW (ref 36.0–46.0)
Hemoglobin: 7.1 g/dL — ABNORMAL LOW (ref 12.0–15.0)
MCH: 31.4 pg (ref 26.0–34.0)
MCHC: 34.3 g/dL (ref 30.0–36.0)
MCV: 91.6 fL (ref 78.0–100.0)
PLATELETS: 178 10*3/uL (ref 150–400)
RBC: 2.26 MIL/uL — AB (ref 3.87–5.11)
RDW: 14.6 % (ref 11.5–15.5)
WBC: 8.2 10*3/uL (ref 4.0–10.5)

## 2014-03-05 LAB — PH, BODY FLUID: pH, Fluid: 7.5

## 2014-03-05 LAB — LACTATE DEHYDROGENASE: LDH: 240 U/L (ref 94–250)

## 2014-03-05 LAB — PROTEIN, TOTAL: Total Protein: 5.2 g/dL — ABNORMAL LOW (ref 6.0–8.3)

## 2014-03-05 LAB — GLUCOSE, SEROUS FLUID: Glucose, Fluid: 119 mg/dL

## 2014-03-05 LAB — PREPARE RBC (CROSSMATCH)

## 2014-03-05 LAB — FOLATE

## 2014-03-05 MED ORDER — ACETAMINOPHEN 325 MG PO TABS
650.0000 mg | ORAL_TABLET | Freq: Once | ORAL | Status: AC
  Administered 2014-03-06: 650 mg via ORAL
  Filled 2014-03-05: qty 2

## 2014-03-05 MED ORDER — IPRATROPIUM-ALBUTEROL 0.5-2.5 (3) MG/3ML IN SOLN: 3.0000 mL | RESPIRATORY_TRACT | Status: DC

## 2014-03-05 MED ORDER — DIPHENHYDRAMINE HCL 50 MG/ML IJ SOLN
25.0000 mg | Freq: Once | INTRAMUSCULAR | Status: AC
  Administered 2014-03-06: 25 mg via INTRAVENOUS
  Filled 2014-03-05: qty 1

## 2014-03-05 MED ORDER — FUROSEMIDE 10 MG/ML IJ SOLN
INTRAMUSCULAR | Status: AC
  Filled 2014-03-05: qty 4

## 2014-03-05 MED ORDER — IPRATROPIUM-ALBUTEROL 0.5-2.5 (3) MG/3ML IN SOLN
3.0000 mL | RESPIRATORY_TRACT | Status: DC | PRN
Start: 2014-03-05 — End: 2014-03-08

## 2014-03-05 MED ORDER — FUROSEMIDE 10 MG/ML IJ SOLN: 20.0000 mg | Freq: Once | INTRAMUSCULAR | Status: DC

## 2014-03-05 NOTE — Progress Notes (Signed)
MD paged and aware, Pt refusing blood transfusion at this time due to the risk of her daughter coming in to see her while blood is transfusing. Pt states she does not want her daughter to know about the Mass or blood transfusion at this time. She states she will tell her on her own time. Pt states she wants her blood transfusion started at 9-9:30pm tonight. MD fine with this. Orders to be adjusted to satisfy pt's wishes. Will continue to monitor.

## 2014-03-05 NOTE — Progress Notes (Signed)
Pt doing well Hip rom non tender right Anemia and chest mass management per primary team

## 2014-03-05 NOTE — Progress Notes (Addendum)
Subjective:  Pt seen and examined in AM. No acute events overnight.  Blood pressure improved, now 147/55. Breathing comfortably on 1 L [place]. Hg 7.1 this AM with no symptoms of weakness, fatigue, lightheadedness, or dyspnea.    Objective: Vital signs in last 24 hours: Filed Vitals:   03/05/14 0400 03/05/14 0430 03/05/14 0752 03/05/14 0800  BP:  147/55    Pulse:  106    Temp:  98.8 F (37.1 C)    TempSrc:  Oral    Resp: 20 20  20   Height:      Weight:      SpO2: 95% 95% 96%    Weight change:   Intake/Output Summary (Last 24 hours) at 03/05/14 1119 Last data filed at 03/05/14 0900  Gross per 24 hour  Intake    480 ml  Output      0 ml  Net    480 ml   PHYSICAL EXAM:  General: NAD Heart: Normal rate and rhythm. Systolic murmur present  Lungs: Breathing comfortably on 1L [place].  Decreased breath sounds. No wheezing, ronchi, or rales Abdomen: Soft, non-distended, non-tender, normal BS Musculoskeletal: Limited movement of right leg and hip due to pain, no swelling.  Neuro: A & O x 3  Lab Results: Basic Metabolic Panel:  Recent Labs Lab 03/04/14 0322 03/05/14 0615  NA 139 139  K 4.2 4.5  CL 108 107  CO2 20 22  GLUCOSE 112* 102*  BUN 22 16  CREATININE 0.86 0.75  CALCIUM 8.8 8.8   Liver Function Tests:  Recent Labs Lab 02/28/14 1319  AST 29  ALT 17  ALKPHOS 83  BILITOT 0.9  PROT 6.3  ALBUMIN 3.1*   CBC:  Recent Labs Lab 03/01/14 0117  03/04/14 0322 03/05/14 0615  WBC 9.9  < > 8.6 8.2  NEUTROABS 7.9*  --  7.1  --   HGB 13.1  < > 7.3* 7.1*  HCT 39.4  < > 21.7* 20.7*  MCV 92.5  < > 92.3 91.6  PLT 130*  < > 136* 178  < > = values in this interval not displayed. Cardiac Enzymes:  Recent Labs Lab 02/28/14 1319 02/28/14 1928 03/01/14 0117  TROPONINI <0.30 <0.30 <0.30   Hemoglobin A1C:  Recent Labs Lab 03/01/14 0117  HGBA1C 5.6    Urinalysis:  Recent Labs Lab 02/28/14 1401  COLORURINE AMBER*  LABSPEC 1.042*  PHURINE 6.5    GLUCOSEU NEGATIVE  HGBUR NEGATIVE  BILIRUBINUR NEGATIVE  KETONESUR 15*  PROTEINUR >300*  UROBILINOGEN 1.0  NITRITE NEGATIVE  LEUKOCYTESUR SMALL*     Studies/Results: Ct Angio Chest Pe W/cm &/or Wo Cm  03/04/2014   CLINICAL DATA:  Hypotension, hypoxia, recent hip replacement.  EXAM: CT ANGIOGRAPHY CHEST WITH CONTRAST  TECHNIQUE: Multidetector CT imaging of the chest was performed using the standard protocol during bolus administration of intravenous contrast. Multiplanar CT image reconstructions and MIPs were obtained to evaluate the vascular anatomy.  CONTRAST:  58mL OMNIPAQUE IOHEXOL 350 MG/ML SOLN  COMPARISON:  Chest x-ray Feb 28, 2014  FINDINGS: There is no pulmonary embolus. There is subcarinal lymphadenopathy measuring 3.2 x 1.8 cm. There is a 1.2 cm lymph node in the right hilum. There is an irregular mass is in anterior medial left upper lobe measuring 2 x 3.5 cm. There is a 0.3 cm focus of calcification within this mass. There are moderate bilateral layering pleural effusions with adjacent compression atelectasis of the posterior bilateral lower lobes. There is no  focal pneumonia. The heart size is normal. Minimal pericardial effusion is identified. The visualized upper abdominal structures are unremarkable. No definite focal discrete lytic or blastic lesion is identified within the visualized bones.  Review of the MIP images confirms the above findings.  IMPRESSION: No pulmonary embolus.  Left upper lobe mass with mediastinal and right hilar lymphadenopathy. Findings are suspicious for neoplasm.  Moderate bilateral pleural effusions.   Electronically Signed   By: Abelardo Diesel M.D.   On: 03/04/2014 19:21   Medications: I have reviewed the patient's current medications. Scheduled Meds: . acetaminophen  650 mg Oral Once  . aspirin EC  325 mg Oral Q breakfast  . calcium carbonate  1,250 mg Oral Q breakfast  . cholecalciferol  800 Units Oral Daily  . diphenhydrAMINE  25 mg Intravenous  Once  . feeding supplement (ENSURE COMPLETE)  237 mL Oral Q1500  . furosemide  20 mg Intravenous Once  . ipratropium-albuterol  3 mL Nebulization Q6H  . multivitamin with minerals  1 tablet Oral Daily  . sodium chloride  3 mL Intravenous Q12H  . sodium chloride  3 mL Intravenous Q12H   Continuous Infusions:   PRN Meds:.acetaminophen, albuterol, bisacodyl, docusate sodium, HYDROcodone-acetaminophen, menthol-cetylpyridinium, phenol, promethazine, promethazine, senna, senna-docusate, sodium chloride Assessment/Plan:   Symptomatic Acute Anemia - Currently not dyspneic but still hypoxic. Hg from 13.8 on admission to 7.1 this AM with no active bleeding s/p surgery (minimal blood loss per operative report). Most likely due to femoral neck fracture. -Monitor CBC -Transfuse 1 pRBC for goal Hg > 7  and IV lasix 20 mg  -Monitor for bleeding  -Obtain anemia panel before transfusion  -Obtain FOBT   Hypoxia - Currently on 1L O2 with SpO2 96%.   Etiology most likely multifactorial from symtopmatic anemia, left upper lobe lung mass, and possible undiagnosed COPD -Oxygen supplementation to keep SpO2 > 92% -Duonebs Q 6 hr -Albuterol Q 4 hr PRN   Elevated d-dimer - 1.9 on 5/16. CTA chest on 5/16 negative for PE. Etiology likely due to post-surgery and malignancy.  -Obtain b/l Dopplers US   Left Upper Lobe Lung Mass with adnenopathy &  b/l moderate layering pleural effusions - CTA chest on 5/13 with  irregular mass is in anterior medial left upper lobe measuring 2 x 3.5 cm with 0.3 cm focus of calcification within this mass. Also with subcarinal lymphadenopathy measuring 3.2 x 1.8 cm. There is a 1.2 cm lymph node in the right hilum. Most likely with NSCLC. Pt past smoker (2 yrs) and second hand exposure -Diagnostic & therapeutic US guided thoracentesis today -F/U pleural effusion labs -Obtain PTH and PTHrP in setting of femoral neck fracture  -Outpatient work-up   Acute Nondisplaced Right Femoral Neck  Fracture s/p anterior THR on 5/13 - Etiology most likely from undiagnosed osteoporosis due to advanced age vs pathological fracture from malignancy. Vitamin 25 OH level normal (64).  -Appreciate orthopedic recommendations  -Norco/vicodin 1-2 Q 4hr pain - ASA 325 mg daily for 4 weeks & SCD's for DVT prophylaxis  -Continue oscal 1250 mg daily & vitmain D 800 U daily -Consider outpatient DEXA scan & bisphosphonate therapy   -Up with therapy, 50% partial weight bearing. -Orthopedic follow-up in 2 weeks with Dr. Lorin Mercy  -Ice packs PRN pain  -Wound care daily/PRN, to be covered for 2 weeks  -SNF once medically stable   Hypotension in setting of recent syncope - Resolved.  Etiology likely from pain medications vs hypovolemia.    -1 L  NS bolus as needed  Prolonged QT - 12-lead EKFG on 5/12 with QTc 503. -Avoid QT prolonging medications   Possible Undiagnosed COPD -. CXR on 5/12 with lungs that are mildly hyperinflated with attenuation of the pulmonary markings, compatible with COPD. Pt past smoker (2 yrs) and second hand exposure. -Oxygen supplementation to keep SpO2 > 92% -Duonebs Q 6 hr -Outpatient PFT's  Pyuria without cystitis- Pt with UA on 5/12 with small LE, pyuria (7-10), and bacteruria (many). No leukocytosis.  -Monitor for symptoms  -No antibiotic therapy indicated at this time  Hyperglycemia - A1c 5.7 on 5/13   -Monitor for prediabetes  Nonoliguric AKI  -Resolved. s/p IVF's.  Most likely prerenal azotemia from hypovolemia from hypotension.  -Monitor BMP in setting of recent contrast -Bladder scan with I & O catherization PRN (>300cc) -Avoid nephrotoxins  Thrombocytopenia - Resolved. Platelet count now normal. 161K on admission with no active bleeding.  -Monitor CBC -Monitor for bleeding  Code: Full  DVT Ppx: ASA 325 mg, SCD's  Diet: Regular    Dispo: SNF once medically stable  The patient does not have a current PCP (No primary provider on file.) and does need an [facility]  hospital follow-up appointment after discharge.  The patient does have transportation limitations that hinder transportation to clinic appointments.  .Services Needed at time of discharge: Y = Yes, Blank = No PT:   OT:   RN:   Equipment:  Rolling [place] with 5" wheels  Other:     LOS: 5 days   Juluis Mire, MD 03/05/2014, 11:19 AM

## 2014-03-05 NOTE — Progress Notes (Signed)
    Day 5 of stay      Patient name: Michele Johns  Medical record number: [id number]  Date of birth: 05-14-1930   Met with patient and discussed the CT findings of mass and effusion. Discussed that "the growth could be cancer and we are going to do tests to find out". The patient voiced understanding. She does not want her daughter to know yet. She otherwise feels well.    Recent Labs Lab 03/01/14 0117 03/02/14 0345 03/03/14 0314 03/04/14 0322 03/05/14 0615  HGB 13.1 9.6* 7.7* 7.3* 7.1*     Assessment and Plan   CT findings - Discussed with patient. Thoracentesis done and cytopathology sent. 530cc of non-hemorrhagic fluid removed.  Symptomatic anemia post surgery - although the patient has been recovering well, her hemoglobin has been decreasing. Unsure of etiology. Surgical site looks well healing and there is no swelling around it regarding ? of hematoma. PRBC transfusion. No more hypotension.   Continue to follow ortho for THR.   I have discussed the care of this patient with my IM team residents. Please see the resident note for details.  Jens Siems 03/05/2014, 2:14 PM.

## 2014-03-05 NOTE — Progress Notes (Signed)
MD paged and aware. Pt states that she has not seen dr yet this am and wants to talk to MD concerning blood transfusion and US Thoracentesis prior to starting either. [place] dept updated and to wait until MD talks to pt. Will continue to monitor.

## 2014-03-05 NOTE — Procedures (Signed)
US guided diagnostic/therapeutic right thoracentesis performed yielding 530 cc's clear, light yellow fluid. The fluid was sent to the lab for preordered studies. F/u CXR pending. No immediate complications.

## 2014-03-06 DIAGNOSIS — M79609 Pain in unspecified limb: Secondary | ICD-10-CM

## 2014-03-06 DIAGNOSIS — I517 Cardiomegaly: Secondary | ICD-10-CM

## 2014-03-06 LAB — CBC WITH DIFFERENTIAL/PLATELET
Basophils Absolute: 0 10*3/uL (ref 0.0–0.1)
Basophils Relative: 0 % (ref 0–1)
EOS PCT: 3 % (ref 0–5)
Eosinophils Absolute: 0.2 10*3/uL (ref 0.0–0.7)
HCT: 19.2 % — ABNORMAL LOW (ref 36.0–46.0)
Hemoglobin: 6.3 g/dL — CL (ref 12.0–15.0)
Lymphocytes Relative: 9 % — ABNORMAL LOW (ref 12–46)
Lymphs Abs: 0.7 10*3/uL (ref 0.7–4.0)
MCH: 30.1 pg (ref 26.0–34.0)
MCHC: 32.8 g/dL (ref 30.0–36.0)
MCV: 91.9 fL (ref 78.0–100.0)
Monocytes Absolute: 0.6 10*3/uL (ref 0.1–1.0)
Monocytes Relative: 8 % (ref 3–12)
NEUTROS PCT: 80 % — AB (ref 43–77)
Neutro Abs: 6.3 10*3/uL (ref 1.7–7.7)
PLATELETS: 218 10*3/uL (ref 150–400)
RBC: 2.09 MIL/uL — AB (ref 3.87–5.11)
RDW: 14.4 % (ref 11.5–15.5)
WBC: 7.8 10*3/uL (ref 4.0–10.5)

## 2014-03-06 LAB — PRO B NATRIURETIC PEPTIDE: Pro B Natriuretic peptide (BNP): 5437 pg/mL — ABNORMAL HIGH (ref 0–450)

## 2014-03-06 LAB — BASIC METABOLIC PANEL
BUN: 18 mg/dL (ref 6–23)
CHLORIDE: 103 meq/L (ref 96–112)
CO2: 27 meq/L (ref 19–32)
Calcium: 9.2 mg/dL (ref 8.4–10.5)
Creatinine, Ser: 0.79 mg/dL (ref 0.50–1.10)
GFR calc Af Amer: 88 mL/min — ABNORMAL LOW (ref >90)
GFR calc non Af Amer: 76 mL/min — ABNORMAL LOW (ref >90)
GLUCOSE: 105 mg/dL — AB (ref 70–99)
POTASSIUM: 3.9 meq/L (ref 3.7–5.3)
Sodium: 142 mEq/L (ref 137–147)

## 2014-03-06 LAB — PTH, INTACT AND CALCIUM
CALCIUM TOTAL (PTH): 8.5 mg/dL (ref 8.4–10.5)
PTH: 18.3 pg/mL (ref 14.0–72.0)

## 2014-03-06 LAB — HEMOGLOBIN AND HEMATOCRIT, BLOOD
HCT: 28 % — ABNORMAL LOW (ref 36.0–46.0)
Hemoglobin: 9.6 g/dL — ABNORMAL LOW (ref 12.0–15.0)

## 2014-03-06 LAB — PREPARE RBC (CROSSMATCH)

## 2014-03-06 MED ORDER — DIPHENHYDRAMINE HCL 50 MG/ML IJ SOLN
INTRAMUSCULAR | Status: AC
  Filled 2014-03-06: qty 1

## 2014-03-06 MED ORDER — FUROSEMIDE 10 MG/ML IJ SOLN
20.0000 mg | Freq: Once | INTRAMUSCULAR | Status: AC
  Administered 2014-03-06: 20 mg via INTRAVENOUS
  Filled 2014-03-06: qty 2

## 2014-03-06 NOTE — Progress Notes (Signed)
Upon Dr. Theodosia Blender phone request, pt's labia was examined for report of a hard area, upon examination a small ping-pong size ball was felt in the right lower labia, no drainage was seen and no foul odor, felt fluid filled, pt stated "i have had that for years", Dr. Radford Pax made aware of findings, will continue to monitor pt Rickard Rhymes, RN

## 2014-03-06 NOTE — Progress Notes (Signed)
Subjective:  Pt seen and examined in AM. Overnight, a "hardened knot" was noted on her right labia . Breathing comfortably on 1 L [place]. 1 unit of blood was transfused this AM with a progression of 7.1 to 9.3 Hg. Daughter was not in the room. Echo was being complete during exam. No symptoms of weakness, fatigue, lightheadedness, chest pain, or dyspnea.    Objective: Vital signs in last 24 hours: Filed Vitals:   03/06/14 0741 03/06/14 1027 03/06/14 1039 03/06/14 1117  BP:      Pulse:      Temp:      TempSrc:      Resp: 18   18  Height:      Weight:      SpO2: 97% 93% 93% 93%   Weight change:   Intake/Output Summary (Last 24 hours) at 03/06/14 1402 Last data filed at 03/06/14 1300  Gross per 24 hour  Intake    132 ml  Output   1050 ml  Net   -918 ml   PHYSICAL EXAM:  General: NAD Heart: Normal rate and rhythm. Systolic murmur present  Lungs: Breathing comfortably on 1L [place].  Decreased breath sounds. No wheezing, ronchi, or rales Abdomen: Soft, non-distended, non-tender, normal BS Musculoskeletal: Limited movement of right leg and hip due to pain, no swelling.  Neuro: A & O x 3  Lab Results: Basic Metabolic Panel:  Recent Labs Lab 03/05/14 0615 03/06/14 1010  NA 139 142  K 4.5 3.9  CL 107 103  CO2 22 27  GLUCOSE 102* 105*  BUN 16 18  CREATININE 0.75 0.79  CALCIUM 8.8  8.5 9.2   Liver Function Tests:  Recent Labs Lab 02/28/14 1319 03/05/14 1054  AST 29  --   ALT 17  --   ALKPHOS 83  --   BILITOT 0.9  --   PROT 6.3 5.2*  ALBUMIN 3.1*  --    CBC:  Recent Labs Lab 03/04/14 0322 03/05/14 0615 03/06/14 0324 03/06/14 1016  WBC 8.6 8.2 7.8  --   NEUTROABS 7.1  --  6.3  --   HGB 7.3* 7.1* 6.3* 9.6*  HCT 21.7* 20.7* 19.2* 28.0*  MCV 92.3 91.6 91.9  --   PLT 136* 178 218  --    Cardiac Enzymes:  Recent Labs Lab 02/28/14 1319 02/28/14 1928 03/01/14 0117  TROPONINI <0.30 <0.30 <0.30   Hemoglobin A1C:  Recent Labs Lab 03/01/14 0117  HGBA1C  5.6    Urinalysis:  Recent Labs Lab 02/28/14 1401  COLORURINE AMBER*  LABSPEC 1.042*  PHURINE 6.5  GLUCOSEU NEGATIVE  HGBUR NEGATIVE  BILIRUBINUR NEGATIVE  KETONESUR 15*  PROTEINUR >300*  UROBILINOGEN 1.0  NITRITE NEGATIVE  LEUKOCYTESUR SMALL*   Results for RUBIE, FICCO (MRN [id number]) as of 03/06/2014 13:57  Ref. Range 03/05/2014 12:42  Albumin, Fluid No range found 0.5  Fluid Type-FALB No range found PLEURAL  Glucose, Fluid No range found 119  Fluid Type-FGLU No range found PLEURAL  Fluid Type-FLDH No range found PLEURAL  LD, Fluid Latest Range: 3-23 U/L 58 (H)  pH, Fluid No range found 7.50  pH, Fluid Type No range found CORRECTED ON 05/17 AT 1313: PREVIOUSLY REPORTED AS PLEURAL FLD  Total protein, fluid No range found 0.7  Fluid Type-FCT No range found PLEURAL  Fluid Type-FTP No range found PLEURAL   . Studies/Results: Dg Chest 1 View  03/05/2014   CLINICAL DATA:  Post right thoracentesis  EXAM: CHEST - 1 VIEW  COMPARISON:  US THORACENTESIS ASP PLEURAL SPACE W/IMG GUIDE dated 03/05/2014  FINDINGS: Left apical mass reidentified. Small left pleural effusion is identified. Trace residual right effusion is identified without pneumothorax. There is a skin fold overlying the left hemi thorax, without pneumothorax identified. No mediastinal shift. Chronically prominent interstitial markings are reidentified.  IMPRESSION: No pneumothorax after thoracentesis.  Stable left apical mass.   Electronically Signed   By: Conchita Paris M.D.   On: 03/05/2014 14:02   Ct Angio Chest Pe W/cm &/or Wo Cm  03/04/2014   CLINICAL DATA:  Hypotension, hypoxia, recent hip replacement.  EXAM: CT ANGIOGRAPHY CHEST WITH CONTRAST  TECHNIQUE: Multidetector CT imaging of the chest was performed using the standard protocol during bolus administration of intravenous contrast. Multiplanar CT image reconstructions and MIPs were obtained to evaluate the vascular anatomy.  CONTRAST:  18mL OMNIPAQUE IOHEXOL 350  MG/ML SOLN  COMPARISON:  Chest x-ray Feb 28, 2014  FINDINGS: There is no pulmonary embolus. There is subcarinal lymphadenopathy measuring 3.2 x 1.8 cm. There is a 1.2 cm lymph node in the right hilum. There is an irregular mass is in anterior medial left upper lobe measuring 2 x 3.5 cm. There is a 0.3 cm focus of calcification within this mass. There are moderate bilateral layering pleural effusions with adjacent compression atelectasis of the posterior bilateral lower lobes. There is no focal pneumonia. The heart size is normal. Minimal pericardial effusion is identified. The visualized upper abdominal structures are unremarkable. No definite focal discrete lytic or blastic lesion is identified within the visualized bones.  Review of the MIP images confirms the above findings.  IMPRESSION: No pulmonary embolus.  Left upper lobe mass with mediastinal and right hilar lymphadenopathy. Findings are suspicious for neoplasm.  Moderate bilateral pleural effusions.   Electronically Signed   By: Abelardo Diesel M.D.   On: 03/04/2014 19:21   US Thoracentesis Asp Pleural Space W/img Guide  03/05/2014   CLINICAL DATA:  Left lung mass; right greater than left pleural effusions, dyspnea. Request is made for diagnostic and therapeutic right thoracentesis.  EXAM: ULTRASOUND GUIDED DIAGNOSTIC AND THERAPEUTIC RIGHT THORACENTESIS  COMPARISON:  None.  PROCEDURE: An ultrasound guided thoracentesis was thoroughly discussed with the patient and questions answered. The benefits, risks, alternatives and complications were also discussed. The patient understands and wishes to proceed with the procedure. Written consent was obtained.  Ultrasound was performed to localize and mark an adequate pocket of fluid in the right chest. The area was then prepped and draped in the normal sterile fashion. 1% Lidocaine was used for local anesthesia. Under ultrasound guidance a 19 gauge Yueh catheter was introduced. Thoracentesis was performed. The  catheter was removed and a dressing applied.  Complications:  None.  FINDINGS: A total of approximately 530 cc's of clear, light yellow fluid was removed. The fluid sample wassent for laboratory analysis.  IMPRESSION: Successful ultrasound guided diagnostic and therapeutic right thoracentesis yielding 530 cc's of pleural fluid.  Read by: Rowe Robert ,P.A.-C.   Electronically Signed   By: Maryclare Bean M.D.   On: 03/05/2014 12:42   Medications: I have reviewed the patient's current medications. Scheduled Meds: . aspirin EC  325 mg Oral Q breakfast  . calcium carbonate  1,250 mg Oral Q breakfast  . cholecalciferol  800 Units Oral Daily  . feeding supplement (ENSURE COMPLETE)  237 mL Oral Q1500  . multivitamin with minerals  1 tablet Oral Daily  . sodium chloride  3 mL Intravenous Q12H  . sodium chloride  3 mL Intravenous Q12H   Continuous Infusions:   PRN Meds:.acetaminophen, albuterol, bisacodyl, docusate sodium, HYDROcodone-acetaminophen, ipratropium-albuterol, menthol-cetylpyridinium, phenol, promethazine, promethazine, senna, senna-docusate, sodium chloride Assessment/Plan:   Symptomatic Acute Anemia - Currently not dyspneic but still hypoxic. Hg from 13.8 on admission to 6.3 on 5/18 with no active bleeding s/p surgery (minimal blood loss per operative report). Today, Hg has improved to 9.6 after a unit of blood -Monitor CBC -Transfused 1 pRBC on 5/18 for goal Hg > 7  and IV lasix 20 mg  -Monitor for bleeding  -Obtain anemia panel before transfusion  -Obtain FOBT   Hypoxia - Currently on 1L O2 with SpO2 96%.   Etiology most likely multifactorial from symtopmatic anemia, left upper lobe lung mass, and possible undiagnosed COPD -Oxygen supplementation to keep SpO2 > 92% -Duonebs Q 6 hr -Albuterol Q 4 hr PRN   Elevated d-dimer - 1.9 on 5/16. CTA chest on 5/16 negative for PE. Etiology likely due to post-surgery and malignancy.  -b/l Dopplers US negative for DVT, superficial thrombosis or  baker's cyst.   Left Upper Lobe Lung Mass with adnenopathy &  b/l moderate layering pleural effusions - CTA chest on 5/13 with  irregular mass is in anterior medial left upper lobe measuring 2 x 3.5 cm with 0.3 cm focus of calcification within this mass. Also with subcarinal lymphadenopathy measuring 3.2 x 1.8 cm. There is a 1.2 cm lymph node in the right hilum. Most likely with NSCLC. Pt past smoker (2 yrs) and second hand exposure -Diagnostic & therapeutic US guided thoracentesis today -F/U pleural effusion labs -Obtain PTH and PTHrP in setting of femoral neck fracture  -Outpatient work-up  - Echo performed today. Awaiting results  Acute Nondisplaced Right Femoral Neck Fracture s/p anterior THR on 5/13 - Etiology most likely from undiagnosed osteoporosis due to advanced age vs pathological fracture from malignancy. Vitamin 25 OH level normal (64).  -Appreciate orthopedic recommendations  -Norco/vicodin 1-2 Q 4hr pain - ASA 325 mg daily for 4 weeks & SCD's for DVT prophylaxis  -Continue oscal 1250 mg daily & vitmain D 800 U daily -Consider outpatient DEXA scan & bisphosphonate therapy   -Up with therapy, 50% partial weight bearing. -Orthopedic follow-up in 2 weeks with Dr. Lorin Mercy  -Ice packs PRN pain  -Wound care daily/PRN, to be covered for 2 weeks  -SNF once medically stable   Hypotension in setting of recent syncope - Resolved.  Etiology likely from pain medications vs hypovolemia.    -1 L NS bolus as needed  Prolonged QT - 12-lead EKFG on 5/12 with QTc 503. -Avoid QT prolonging medications   Possible Undiagnosed COPD -. CXR on 5/12 with lungs that are mildly hyperinflated with attenuation of the pulmonary markings, compatible with COPD. Pt past smoker (2 yrs) and second hand exposure. -Oxygen supplementation to keep SpO2 > 92% -Duonebs Q 6 hr -Outpatient PFT's  Pyuria without cystitis- Pt with UA on 5/12 with small LE, pyuria (7-10), and bacteruria (many). No leukocytosis.   -Monitor for symptoms  -No antibiotic therapy indicated at this time  Hyperglycemia - A1c 5.7 on 5/13   -Monitor for prediabetes  Nonoliguric AKI  -Resolved. s/p IVF's.  Most likely prerenal azotemia from hypovolemia from hypotension.  -Monitor BMP in setting of recent contrast -Bladder scan with I & O catherization PRN (>300cc) -Avoid nephrotoxins  Thrombocytopenia - Resolved. Platelet count now normal. 161K on admission with no active bleeding.  -Monitor CBC -Monitor for bleeding  Code: Full  DVT Ppx: ASA  325 mg, SCD's  Diet: Regular    Dispo: SNF once medically stable  The patient does not have a current PCP (No primary provider on file.) and does need an [facility] hospital follow-up appointment after discharge.  The patient does have transportation limitations that hinder transportation to clinic appointments.  .Services Needed at time of discharge: Y = Yes, Blank = No PT: Y  OT: Y  RN:   Equipment:  Rolling Degrace with 5" wheels  Other:     LOS: 6 days   Judithe Modest, Med Student 03/06/2014, 2:02 PM

## 2014-03-06 NOTE — Progress Notes (Signed)
Daughter texted CSW asking about payment for SNF. Spoke with pt's daughter informing her SNF will be covered for days 1-20 at 100% and days 21-100 at 80%. Secondary insurance will likely pick up the 20% difference if she stays past day 20. CSW informed pt that covering CSW today is [facility], and provided Donna's contact info. Daughter states she is down to 4 different choices for SNF and will let Butch Penny know when she makes a selection. This CSW signing off.   Ky Barban, MSW, [facility] Clinical Social Worker [phone]

## 2014-03-06 NOTE — Progress Notes (Signed)
  Echocardiogram 2D Echocardiogram has been performed.  Michele Johns 03/06/2014, 11:59 AM

## 2014-03-06 NOTE — Progress Notes (Signed)
Physical Therapy Treatment Patient Details Name: Michele Johns MRN: [id number] DOB: [birth year] Today's Date: 03/06/2014    History of Present Illness Pt adm with rt femoral neck fx and syncopal episode. Pt underwent Rt. THA with anterior approach.    PT Comments    Patient reluctant to get out of bed but agreeable with some encouragement. Completed Therex well. Limited with ambulation. Patient able to recall precautions but unable to recall 50% PWB. Continue to recommend SNF for ongoing Physical Therapy.     Follow Up Recommendations  SNF     Equipment Recommendations  Rolling Clock with 5" wheels    Recommendations for Other Services       Precautions / Restrictions Precautions Precautions: Fall;Anterior Hip Restrictions RLE Weight Bearing: Partial weight bearing RLE Partial Weight Bearing Percentage or Pounds: 50%    Mobility  Bed Mobility Overal bed mobility: Needs Assistance Bed Mobility: Supine to Sit     Supine to sit: Min assist     General bed mobility comments: assist to bring legs over and elevate trunk.  Transfers Overall transfer level: Needs assistance Equipment used: Rolling [place] (2 wheeled)   Sit to Stand: Min assist         General transfer comment: Verbal/tactile cues for hand placement. Assist to power up into standing and to ensure balance  Ambulation/Gait Ambulation/Gait assistance: Min assist Ambulation Distance (Feet): 5 Feet Assistive device: Rolling [place] (2 wheeled) Gait Pattern/deviations: Step-to pattern;Decreased step length - right;Decreased step length - left;Trunk flexed;Narrow base of support     General Gait Details: Frequent verbal/tactile cues for gait sequence and to stay closer to [place].   Stairs            Wheelchair Mobility    Modified Rankin (Stroke Patients Only)       Balance                                    Cognition Arousal/Alertness: Awake/alert Behavior During Therapy:  WFL for tasks assessed/performed Overall Cognitive Status: Within Functional Limits for tasks assessed                      Exercises Total Joint Exercises Quad Sets: AROM;Right;10 reps Heel Slides: AROM;Right;10 reps Hip ABduction/ADduction: AROM;Right;10 reps Long Arc Quad: AROM;10 reps;Seated    General Comments        Pertinent Vitals/Pain no apparent distress     Home Living                      Prior Function            PT Goals (current goals can now be found in the care plan section) Progress towards PT goals: Progressing toward goals    Frequency  Min 3X/week    PT Plan Frequency needs to be updated    Co-evaluation             End of Session Equipment Utilized During Treatment: Gait belt Activity Tolerance: Patient limited by fatigue Patient left: in chair;with call bell/phone within reach;with chair alarm set     Time: 9379-0240 PT Time Calculation (min): 18 min  Charges:  $Therapeutic Exercise: 8-22 mins                    G Codes:      Tonia Brooms Robinette 03/06/2014, 3:18 PM 03/06/2014 Tonia Brooms Robinette PTA  [phone] pager [phone] office

## 2014-03-06 NOTE — Progress Notes (Signed)
While performing peri care noted pt has a hardened knot like area on her right labia.  Will report to oncoming RN. Shalane Florendo P Emmani Lesueur

## 2014-03-06 NOTE — Progress Notes (Signed)
Bilateral lower extremity venous duplex:  No evidence of DVT, superficial thrombosis, or Baker's Cyst.

## 2014-03-06 NOTE — Progress Notes (Signed)
Subjective:  Pt seen and examined in PM. This AM with Hg 6.3 requiring blood transfusion. Currently denies dyspnea and is breathing comfortably on 1 L [place]. No chest pain, weakness, fatigue, lightheadedness. Hip pain well-controlled. Normal urination and BM's. Per nursing note, pt with small ping-pong sized ball in her right lower labia thought to be fluid filled that pt has had for years.    Objective: Vital signs in last 24 hours: Filed Vitals:   03/06/14 0741 03/06/14 1027 03/06/14 1039 03/06/14 1117  BP:      Pulse:      Temp:      TempSrc:      Resp: 18   18  Height:      Weight:      SpO2: 97% 93% 93% 93%   Weight change:   Intake/Output Summary (Last 24 hours) at 03/06/14 1415 Last data filed at 03/06/14 1300  Gross per 24 hour  Intake    132 ml  Output   1050 ml  Net   -918 ml   PHYSICAL EXAM:  General: NAD Heart: Normal rate and rhythm. Systolic murmur present  Lungs: Breathing comfortably on 1L .  Decreased breath sounds. No wheezing, ronchi, or rales Abdomen: Soft, non-distended, non-tender, normal BS Musculoskeletal: Limited movement of right leg and hip due to pain, no swelling.  Neuro: A & O x 3  Lab Results: Basic Metabolic Panel:  Recent Labs Lab 03/05/14 0615 03/06/14 1010  NA 139 142  K 4.5 3.9  CL 107 103  CO2 22 27  GLUCOSE 102* 105*  BUN 16 18  CREATININE 0.75 0.79  CALCIUM 8.8  8.5 9.2   Liver Function Tests:  Recent Labs Lab 02/28/14 1319 03/05/14 1054  AST 29  --   ALT 17  --   ALKPHOS 83  --   BILITOT 0.9  --   PROT 6.3 5.2*  ALBUMIN 3.1*  --    CBC:  Recent Labs Lab 03/04/14 0322 03/05/14 0615 03/06/14 0324 03/06/14 1016  WBC 8.6 8.2 7.8  --   NEUTROABS 7.1  --  6.3  --   HGB 7.3* 7.1* 6.3* 9.6*  HCT 21.7* 20.7* 19.2* 28.0*  MCV 92.3 91.6 91.9  --   PLT 136* 178 218  --    Cardiac Enzymes:  Recent Labs Lab 02/28/14 1319 02/28/14 1928 03/01/14 0117  TROPONINI <0.30 <0.30 <0.30   Hemoglobin  A1C:  Recent Labs Lab 03/01/14 0117  HGBA1C 5.6    Urinalysis:  Recent Labs Lab 02/28/14 1401  COLORURINE AMBER*  LABSPEC 1.042*  PHURINE 6.5  GLUCOSEU NEGATIVE  HGBUR NEGATIVE  BILIRUBINUR NEGATIVE  KETONESUR 15*  PROTEINUR >300*  UROBILINOGEN 1.0  NITRITE NEGATIVE  LEUKOCYTESUR SMALL*     Studies/Results: Dg Chest 1 View  03/05/2014   CLINICAL DATA:  Post right thoracentesis  EXAM: CHEST - 1 VIEW  COMPARISON:  US THORACENTESIS ASP PLEURAL SPACE W/IMG GUIDE dated 03/05/2014  FINDINGS: Left apical mass reidentified. Small left pleural effusion is identified. Trace residual right effusion is identified without pneumothorax. There is a skin fold overlying the left hemi thorax, without pneumothorax identified. No mediastinal shift. Chronically prominent interstitial markings are reidentified.  IMPRESSION: No pneumothorax after thoracentesis.  Stable left apical mass.   Electronically Signed   By: Conchita Paris M.D.   On: 03/05/2014 14:02   Ct Angio Chest Pe W/cm &/or Wo Cm  03/04/2014   CLINICAL DATA:  Hypotension, hypoxia, recent hip  replacement.  EXAM: CT ANGIOGRAPHY CHEST WITH CONTRAST  TECHNIQUE: Multidetector CT imaging of the chest was performed using the standard protocol during bolus administration of intravenous contrast. Multiplanar CT image reconstructions and MIPs were obtained to evaluate the vascular anatomy.  CONTRAST:  28mL OMNIPAQUE IOHEXOL 350 MG/ML SOLN  COMPARISON:  Chest x-ray Feb 28, 2014  FINDINGS: There is no pulmonary embolus. There is subcarinal lymphadenopathy measuring 3.2 x 1.8 cm. There is a 1.2 cm lymph node in the right hilum. There is an irregular mass is in anterior medial left upper lobe measuring 2 x 3.5 cm. There is a 0.3 cm focus of calcification within this mass. There are moderate bilateral layering pleural effusions with adjacent compression atelectasis of the posterior bilateral lower lobes. There is no focal pneumonia. The heart size is normal.  Minimal pericardial effusion is identified. The visualized upper abdominal structures are unremarkable. No definite focal discrete lytic or blastic lesion is identified within the visualized bones.  Review of the MIP images confirms the above findings.  IMPRESSION: No pulmonary embolus.  Left upper lobe mass with mediastinal and right hilar lymphadenopathy. Findings are suspicious for neoplasm.  Moderate bilateral pleural effusions.   Electronically Signed   By: Abelardo Diesel M.D.   On: 03/04/2014 19:21   US Thoracentesis Asp Pleural Space W/img Guide  03/05/2014   CLINICAL DATA:  Left lung mass; right greater than left pleural effusions, dyspnea. Request is made for diagnostic and therapeutic right thoracentesis.  EXAM: ULTRASOUND GUIDED DIAGNOSTIC AND THERAPEUTIC RIGHT THORACENTESIS  COMPARISON:  None.  PROCEDURE: An ultrasound guided thoracentesis was thoroughly discussed with the patient and questions answered. The benefits, risks, alternatives and complications were also discussed. The patient understands and wishes to proceed with the procedure. Written consent was obtained.  Ultrasound was performed to localize and mark an adequate pocket of fluid in the right chest. The area was then prepped and draped in the normal sterile fashion. 1% Lidocaine was used for local anesthesia. Under ultrasound guidance a 19 gauge Yueh catheter was introduced. Thoracentesis was performed. The catheter was removed and a dressing applied.  Complications:  None.  FINDINGS: A total of approximately 530 cc's of clear, light yellow fluid was removed. The fluid sample wassent for laboratory analysis.  IMPRESSION: Successful ultrasound guided diagnostic and therapeutic right thoracentesis yielding 530 cc's of pleural fluid.  Read by: Rowe Robert ,P.A.-C.   Electronically Signed   By: Maryclare Bean M.D.   On: 03/05/2014 12:42   Medications: I have reviewed the patient's current medications. Scheduled Meds: . aspirin EC  325 mg Oral  Q breakfast  . calcium carbonate  1,250 mg Oral Q breakfast  . cholecalciferol  800 Units Oral Daily  . feeding supplement (ENSURE COMPLETE)  237 mL Oral Q1500  . multivitamin with minerals  1 tablet Oral Daily  . sodium chloride  3 mL Intravenous Q12H  . sodium chloride  3 mL Intravenous Q12H   Continuous Infusions:   PRN Meds:.acetaminophen, albuterol, bisacodyl, docusate sodium, HYDROcodone-acetaminophen, ipratropium-albuterol, menthol-cetylpyridinium, phenol, promethazine, promethazine, senna, senna-docusate, sodium chloride Assessment/Plan:   Symptomatic Acute Anemia - Hg 9.6 s/p 1 pRBC from 6.3 this AM, down from 13.8 on admission. Currently not dyspneic but still hypoxic. Pt with no active bleeding s/p surgery (minimal blood loss per operative report). Most likely due to femoral neck fracture. -Monitor CBC -Transfuse if Hg<7  -Monitor for bleeding  -Obtain anemia panel -->retic(wnl), ferritin (127), iron (14 L), TIBC (166 L), TIBC (166 L), Iron  Sat (8 L), folate (wnl), B12 (wnl)   -Obtain FOBT  -Consider right hip XR to assess for hematoma  Hypoxia - Currently on 1L O2 with normal SpO2 .  Etiology most likely multifactorial from symtopmatic anemia, AECHF, left upper lobe lung mass, and possible undiagnosed COPD -Oxygen supplementation to keep SpO2 > 92% -Duonebs Q 6 hr -Albuterol Q 4 hr PRN  -Ambulate with pulse oximetry  Acute on Chronic Diastolic CHF -  2D-echo on 5/18 with EF 65%, mild LVH, and diastolic dysfunction.  Pro-BNP elevated at 5437. Wt gain from admission (100 to 111 lb ?) -Monitor volume status -Monitor daily weight & strict I & O's -IV lasix 20 mg with blood transfusion  Elevated d-dimer - 1.9 on 5/16. CTA chest on 5/16 negative for PE. Etiology likely due to post-surgery and malignancy.  -Obtain b/l Dopplers US --> No DVT  Left Upper Lobe Lung Mass with adnenopathy & b/l moderate layering pleural effusions s/p thoracentesis on 5/17 - CTA chest on 5/13 with  irregular mass is in anterior medial left upper lobe measuring 2 x 3.5 cm with 0.3 cm focus of calcification within this mass. Also with subcarinal lymphadenopathy measuring 3.2 x 1.8 cm. There is a 1.2 cm lymph node in the right hilum. Most likely with NSCLC. Pt past smoker (2 yrs) and second hand exposure -F/U pleural effusion labs --> transudative (0.24 LDH ratio; 0.13 protein ratio) most likely due to AECHF due to IVF resuscitation  -F/U pleural AFB, cytology, culture -Obtain PTH (wnl) and PTHrP in setting of femoral neck fracture  -Outpatient work-up, if family agreeable inpatient pulmonology consult   Acute Nondisplaced Right Femoral Neck Fracture s/p anterior THR on 5/13 - Etiology most likely from undiagnosed osteoporosis due to advanced age vs pathological fracture from malignancy. Vitamin 25 OH level normal (64).  -Appreciate orthopedic recommendations  -Norco/vicodin 1-2 Q 4hr pain - ASA 325 mg daily for 4 weeks & SCD's for DVT prophylaxis  -Continue oscal 1250 mg daily & vitmain D 800 U daily -Consider outpatient DEXA scan & bisphosphonate therapy   -Up with therapy, 50% partial weight bearing. -Orthopedic follow-up in 2 weeks with Dr. Lorin Mercy  -Ice packs PRN pain  -Wound care daily/PRN, to be covered for 2 weeks  -SNF once medically stable   Painless Right Labial Mass - Unclear etiology. Reports present for yrs. -Outpatient work-up  Prolonged QT - 12-lead EKFG on 5/12 with QTc 503. -Avoid QT prolonging medications   Possible Undiagnosed COPD -. CXR on 5/12 with lungs that are mildly hyperinflated with attenuation of the pulmonary markings, compatible with COPD. Pt past smoker (2 yrs) and second hand exposure. -Oxygen supplementation to keep SpO2 > 92% -Duonebs Q 6 hr -Outpatient PFT's  Pyuria without cystitis- Pt with UA on 5/12 with small LE, pyuria (7-10), and bacteruria (many). No leukocytosis.  -Monitor for symptoms  -No antibiotic therapy indicated at this  time  Hyperglycemia - A1c 5.7 on 5/13   -Monitor for prediabetes  Nonoliguric AKI  - Resolved. s/p IVF's.  Most likely prerenal azotemia from hypovolemia from hypotension.  -Monitor BMP in setting of recent contrast -Bladder scan with I & O catherization PRN (>300cc) -Avoid nephrotoxins  Thrombocytopenia - Resolved. Platelet count now normal with no active bleeding. On admission 161K  -Monitor CBC -Monitor for bleeding  Hypotension in setting of recent syncope - Resolved.  Etiology likely from pain medications vs hypovolemia.    -1 L NS bolus as needed  Code: Full  DVT  Ppx: ASA 325 mg, SCD's  Diet: Regular    Dispo: SNF once medically stable  The patient does not have a current PCP (No primary provider on file.) and does need an [facility] hospital follow-up appointment after discharge.  The patient does have transportation limitations that hinder transportation to clinic appointments.  .Services Needed at time of discharge: Y = Yes, Blank = No PT:   OT:   RN:   Equipment:  Rolling [place] with 5" wheels  Other:     LOS: 6 days   Juluis Mire, MD 03/06/2014, 2:15 PM

## 2014-03-06 NOTE — Progress Notes (Signed)
MD paged with morning Hgb value of 6.3.

## 2014-03-07 ENCOUNTER — Encounter: Payer: Self-pay | Admitting: Family Medicine

## 2014-03-07 ENCOUNTER — Inpatient Hospital Stay (HOSPITAL_COMMUNITY): Payer: Medicare Other

## 2014-03-07 DIAGNOSIS — R599 Enlarged lymph nodes, unspecified: Secondary | ICD-10-CM

## 2014-03-07 DIAGNOSIS — R222 Localized swelling, mass and lump, trunk: Secondary | ICD-10-CM

## 2014-03-07 DIAGNOSIS — J90 Pleural effusion, not elsewhere classified: Secondary | ICD-10-CM

## 2014-03-07 LAB — URINALYSIS, ROUTINE W REFLEX MICROSCOPIC
Bilirubin Urine: NEGATIVE
Glucose, UA: NEGATIVE mg/dL
Ketones, ur: NEGATIVE mg/dL
Leukocytes, UA: NEGATIVE
Nitrite: NEGATIVE
Protein, ur: NEGATIVE mg/dL
SPECIFIC GRAVITY, URINE: 1.013 (ref 1.005–1.030)
UROBILINOGEN UA: 0.2 mg/dL (ref 0.0–1.0)
pH: 6 (ref 5.0–8.0)

## 2014-03-07 LAB — BASIC METABOLIC PANEL
BUN: 19 mg/dL (ref 6–23)
CALCIUM: 8.3 mg/dL — AB (ref 8.4–10.5)
CO2: 28 mEq/L (ref 19–32)
Chloride: 106 mEq/L (ref 96–112)
Creatinine, Ser: 0.72 mg/dL (ref 0.50–1.10)
GFR calc Af Amer: >90 mL/min (ref >90)
GFR calc non Af Amer: 78 mL/min — ABNORMAL LOW (ref >90)
Glucose, Bld: 85 mg/dL (ref 70–99)
POTASSIUM: 3.7 meq/L (ref 3.7–5.3)
Sodium: 145 mEq/L (ref 137–147)

## 2014-03-07 LAB — CBC
HCT: 25.4 % — ABNORMAL LOW (ref 36.0–46.0)
HEMOGLOBIN: 8.6 g/dL — AB (ref 12.0–15.0)
MCH: 30.6 pg (ref 26.0–34.0)
MCHC: 33.9 g/dL (ref 30.0–36.0)
MCV: 90.4 fL (ref 78.0–100.0)
Platelets: 277 10*3/uL (ref 150–400)
RBC: 2.81 MIL/uL — ABNORMAL LOW (ref 3.87–5.11)
RDW: 14.4 % (ref 11.5–15.5)
WBC: 6.8 10*3/uL (ref 4.0–10.5)

## 2014-03-07 LAB — PROTIME-INR
INR: 1.08 (ref 0.00–1.49)
Prothrombin Time: 13.8 seconds (ref 11.6–15.2)

## 2014-03-07 LAB — TYPE AND SCREEN
ABO/RH(D): O POS
ANTIBODY SCREEN: NEGATIVE
Unit division: 0

## 2014-03-07 LAB — HEPATIC FUNCTION PANEL
ALBUMIN: 1.7 g/dL — AB (ref 3.5–5.2)
ALK PHOS: 75 U/L (ref 39–117)
ALT: 11 U/L (ref 0–35)
AST: 23 U/L (ref 0–37)
Bilirubin, Direct: <0.2 mg/dL (ref 0.0–0.3)
Total Bilirubin: 0.6 mg/dL (ref 0.3–1.2)
Total Protein: 5 g/dL — ABNORMAL LOW (ref 6.0–8.3)

## 2014-03-07 LAB — URINE MICROSCOPIC-ADD ON

## 2014-03-07 LAB — APTT: APTT: 40 s — AB (ref 24–37)

## 2014-03-07 NOTE — Progress Notes (Signed)
    Day 7 of stay      Patient name: Michele Johns  Medical record number: [id number]  Date of birth: 1930/08/06   Met with patient. Comfortable, reports no pain, walking comfortably with physical therapy.   Filed Vitals:   03/07/14 0500 03/07/14 0736 03/07/14 1124 03/07/14 1325  BP: 121/41   135/48  Pulse: 102   92  Temp: 98.4 F (36.9 C)   98.9 F (37.2 C)  TempSrc: Oral   Oral  Resp: $Remo'17 17 18 18  'dWYrM$ Height:      Weight:      SpO2: 94% 94% 94% 93%    General: Resting in bed. HEENT: PERRL, EOMI, no scleral icterus. Heart: RRR, no rubs, murmurs or gallops. Lungs: Clear to auscultation bilaterally, no wheezes, rales, or rhonchi. Abdomen: Soft, nontender, nondistended, BS present. Extremities: Warm, no pedal edema.  Right Hip surgical site - clean, non-tender, no swelling or drainage.  Neuro: Alert and oriented X3,. Patient denied genital exam for the mass reported   Recent Labs Lab 03/05/14 0615 03/06/14 0324 03/06/14 1016 03/07/14 0540  HGB 7.1* 6.3* 9.6* 8.6*  HCT 20.7* 19.2* 28.0* 25.4*  WBC 8.2 7.8  --  6.8  PLT 178 218  --  277    Recent Labs Lab 03/03/14 0314 03/04/14 0322 03/05/14 0615 03/06/14 1010 03/07/14 0540  NA 136* 139 139 142 145  K 5.0 4.2 4.5 3.9 3.7  CL 104 108 107 103 106  CO2 $Re'25 20 22 27 28  'CqX$ GLUCOSE 131* 112* 102* 105* 85  BUN 26* $Remov'22 16 18 19  'SzIAXd$ CREATININE 1.11* 0.86 0.75 0.79 0.72  CALCIUM 8.2* 8.8 8.8  8.5 9.2 8.3*     Assessment and Plan     Acute anemia - unsure of etiology . Patient refused right hip xray today. We will do a hemolysis panel. Continue to monitor falling hgb.   THR right - improving pain - defer to ortho.   Genital mass - the patient denies genital exam, says its a cyst and has been there for years.   Pulmonary growth - the patient will see pulmonology specialist today so that she can be followed up as outpatient. She does not have a PCP.   I have discussed the care of this patient with my IM team residents.  Please see the resident note for details.  Juley Giovanetti 03/07/2014, 1:39 PM.

## 2014-03-07 NOTE — Progress Notes (Addendum)
Subjective:  Pt seen and examined in AM. No acute events overnight. Reports right hip pain much improved. Breathing comfortably on room air. No dyspnea, lightheadedness,  chest pain, nausea, vomiting, abdominal pain, leg swelling, change in BM or urination. Agreeable to pulmonology consult.    Objective: Vital signs in last 24 hours: Filed Vitals:   03/07/14 0400 03/07/14 0500 03/07/14 0736 03/07/14 1124  BP:  121/41    Pulse:  102    Temp:  98.4 F (36.9 C)    TempSrc:  Oral    Resp: 16 17 17 18   Height:      Weight:      SpO2:  94% 94% 94%   Weight change:   Intake/Output Summary (Last 24 hours) at 03/07/14 1203 Last data filed at 03/07/14 0909  Gross per 24 hour  Intake    360 ml  Output    400 ml  Net    -40 ml   PHYSICAL EXAM:  General: NAD Heart: Normal rate and rhythm. Systolic murmur present  Lungs: Breathing comfortably on RA.  Decreased breath sounds. No wheezing, ronchi, or rales Abdomen: Soft, non-distended, non-tender, normal BS Musculoskeletal: Limited movement of right leg and hip due to pain, no swelling.  Neuro: A & O x 3  Lab Results: Basic Metabolic Panel:  Recent Labs Lab 03/06/14 1010 03/07/14 0540  NA 142 145  K 3.9 3.7  CL 103 106  CO2 27 28  GLUCOSE 105* 85  BUN 18 19  CREATININE 0.79 0.72  CALCIUM 9.2 8.3*   Liver Function Tests:  Recent Labs Lab 02/28/14 1319 03/05/14 1054 03/07/14 0500  AST 29  --  23  ALT 17  --  11  ALKPHOS 83  --  75  BILITOT 0.9  --  0.6  PROT 6.3 5.2* 5.0*  ALBUMIN 3.1*  --  1.7*   CBC:  Recent Labs Lab 03/04/14 0322  03/06/14 0324 03/06/14 1016 03/07/14 0540  WBC 8.6  < > 7.8  --  6.8  NEUTROABS 7.1  --  6.3  --   --   HGB 7.3*  < > 6.3* 9.6* 8.6*  HCT 21.7*  < > 19.2* 28.0* 25.4*  MCV 92.3  < > 91.9  --  90.4  PLT 136*  < > 218  --  277  < > = values in this interval not displayed. Cardiac Enzymes:  Recent Labs Lab 02/28/14 1319 02/28/14 1928 03/01/14 0117  TROPONINI  <0.30 <0.30 <0.30   Hemoglobin A1C:  Recent Labs Lab 03/01/14 0117  HGBA1C 5.6    Urinalysis:  Recent Labs Lab 02/28/14 1401 03/07/14 1122  COLORURINE AMBER* YELLOW  LABSPEC 1.042* 1.013  PHURINE 6.5 6.0  GLUCOSEU NEGATIVE NEGATIVE  HGBUR NEGATIVE SMALL*  BILIRUBINUR NEGATIVE NEGATIVE  KETONESUR 15* NEGATIVE  PROTEINUR >300* NEGATIVE  UROBILINOGEN 1.0 0.2  NITRITE NEGATIVE NEGATIVE  LEUKOCYTESUR SMALL* NEGATIVE     Studies/Results: Dg Chest 1 View  03/05/2014   CLINICAL DATA:  Post right thoracentesis  EXAM: CHEST - 1 VIEW  COMPARISON:  US THORACENTESIS ASP PLEURAL SPACE W/IMG GUIDE dated 03/05/2014  FINDINGS: Left apical mass reidentified. Small left pleural effusion is identified. Trace residual right effusion is identified without pneumothorax. There is a skin fold overlying the left hemi thorax, without pneumothorax identified. No mediastinal shift. Chronically prominent interstitial markings are reidentified.  IMPRESSION: No pneumothorax after thoracentesis.  Stable left apical mass.   Electronically Signed   By: Elzie Rings  Green M.D.   On: 03/05/2014 14:02   US Thoracentesis Asp Pleural Space W/img Guide  03/05/2014   CLINICAL DATA:  Left lung mass; right greater than left pleural effusions, dyspnea. Request is made for diagnostic and therapeutic right thoracentesis.  EXAM: ULTRASOUND GUIDED DIAGNOSTIC AND THERAPEUTIC RIGHT THORACENTESIS  COMPARISON:  None.  PROCEDURE: An ultrasound guided thoracentesis was thoroughly discussed with the patient and questions answered. The benefits, risks, alternatives and complications were also discussed. The patient understands and wishes to proceed with the procedure. Written consent was obtained.  Ultrasound was performed to localize and mark an adequate pocket of fluid in the right chest. The area was then prepped and draped in the normal sterile fashion. 1% Lidocaine was used for local anesthesia. Under ultrasound guidance a 19 gauge Yueh  catheter was introduced. Thoracentesis was performed. The catheter was removed and a dressing applied.  Complications:  None.  FINDINGS: A total of approximately 530 cc's of clear, light yellow fluid was removed. The fluid sample wassent for laboratory analysis.  IMPRESSION: Successful ultrasound guided diagnostic and therapeutic right thoracentesis yielding 530 cc's of pleural fluid.  Read by: Rowe Robert ,P.A.-C.   Electronically Signed   By: Maryclare Bean M.D.   On: 03/05/2014 12:42   Medications: I have reviewed the patient's current medications. Scheduled Meds: . aspirin EC  325 mg Oral Q breakfast  . calcium carbonate  1,250 mg Oral Q breakfast  . cholecalciferol  800 Units Oral Daily  . feeding supplement (ENSURE COMPLETE)  237 mL Oral Q1500  . multivitamin with minerals  1 tablet Oral Daily  . sodium chloride  3 mL Intravenous Q12H  . sodium chloride  3 mL Intravenous Q12H   Continuous Infusions:   PRN Meds:.acetaminophen, albuterol, bisacodyl, docusate sodium, HYDROcodone-acetaminophen, ipratropium-albuterol, menthol-cetylpyridinium, phenol, promethazine, promethazine, senna, senna-docusate, sodium chloride Assessment/Plan:   Symptomatic Acute Anemia - Currently asymptomatic.  Hg 8.6, improved s/p 1 pRBC on 5/18 due to Hg 6.3, below Hg of 13.8 on admission with no active bleeding or hemodynamic instability. Anemia panel on 5/19 with retic(wnl), ferritin (127), iron (14 L), TIBC (166 L), TIBC (166 L), Iron Sat (8 L), folate (wnl), B12 (wnl)  Pt with no reported active bleeding s/p surgery (minimal blood loss per operative report). Possibly due to femoral neck fracture vs inherited bleeding disorder (reported epistaxis as child and menorrhagia)  -Monitor CBC -Transfuse if Hg<7  -Monitor for bleeding  -Obtain FOBT & UA (small Hg with 0-2 RBC's) -Obtain PT/PTT (40 H),  hemolytic anemia workup and von willebrand panel   -Obtain right hip XR to assess for hematoma  Hypoxia - Resolved.  Currently with normal SpO2 on RA. Etiology most likely multifactorial from symtopmatic anemia, b/l pleural effusion,  AECHF, left upper lobe lung mass, and possible undiagnosed COPD. -Oxygen supplementation to keep SpO2 > 92% -Duonebs Q 4 hr PRN -Albuterol nebulizer Q 4 hr PRN  -Ambulate with pulse oximetry  Left Upper Lobe Lung Mass with adnenopathy & b/l moderate layering transudative pleural effusions s/p thoracentesis on 5/17 - CTA chest on 5/13 with irregular mass is in anterior medial left upper lobe measuring 2 x 3.5 cm with 0.3 cm focus of calcification within this mass. Also with subcarinal lymphadenopathy measuring 3.2 x 1.8 cm. There is a 1.2 cm lymph node in the right hilum. Most likely with NSCLC. Pt past smoker (5 yrs) and second hand exposure -F/U pleural AFB (NGTD), cytology (no malignant cells), culture (NGTD)--> -F/U PTHrP in setting of femoral  neck fracture  -Obtain inpatient pulmonology consult --> recommend PET scan after rehab with possible EBUS and sampling if lymph nodes hypermetabolic  -Outpatient appointment scheduled 03/29/14 with Dr. Elsworth Soho  Acute Nondisplaced Right Femoral Nontraumatic Neck Fracture s/p anterior THR on 5/13 - Currently with minimal pain. Etiology most likely from undiagnosed osteoporosis due to advanced age vs pathological fracture from malignancy. Vitamin 25 OH level normal (64).  -Appreciate orthopedic recommendations  -Norco/vicodin 1-2 Q 4hr pain - ASA 325 mg daily for 4 weeks & SCD's for DVT prophylaxis  -Continue oscal 1250 mg daily & vitmain D 800 U daily -Consider outpatient DEXA scan & bisphosphonate therapy   -Up with therapy, 50% partial weight bearing. -Orthopedic follow-up in 2 weeks with Dr. Lorin Mercy  -Ice packs PRN pain  -Wound care daily/PRN, to be covered for 2 weeks  -SNF once medically stable   Acute on Chronic Diastolic CHF -  Resolved.  2D-echo on 5/18 with EF 65%, mild LVH, and diastolic dysfunction.  Pro-BNP elevated at 5437. Wt  gain from admission (100 to 111 lb ?) -Monitor volume status -Monitor daily weight & strict I & O's  Elevated d-dimer - 1.9 on 5/16. CTA chest on 5/16 negative for PE. Etiology likely due to post-surgery and malignancy. No evidence of DVT on b/l Dopplers US.  Painless Right Labial Mass - Unclear etiology, most likely benign cyst. Reports present for yrs. Declined physical examination.  -Outpatient GYN follow-up   Prolonged QT - 12-lead EKFG on 5/12 with QTc 503. -Avoid QT prolonging medications   Possible Undiagnosed COPD -. CXR on 5/12 with lungs that are mildly hyperinflated with attenuation of the pulmonary markings, compatible with COPD. Pt past smoker (2 yrs) and second hand exposure. -Oxygen supplementation to keep SpO2 > 92% -Duonebs Q 4 hr PRN -Albuterol nebulizer Q 4 hr PRN -Outpatient PFT's  Pyuria without cystitis- Pt with UA on 5/12 with small LE, pyuria (7-10), and bacteruria (many). No leukocytosis.  -Monitor for symptoms  -No antibiotic therapy indicated at this time  Hyperglycemia - A1c 5.6 on 5/13   -Outpatient monitoring of pre-diabetes  Nonoliguric AKI  - Resolved. s/p IVF's.  Most likely prerenal azotemia from hypovolemia from hypotension.  -Monitor BMP in setting of recent contrast -Bladder scan with I & O catherization PRN if >300cc) -Avoid nephrotoxins  Thrombocytopenia - Resolved. Platelet count now normal with no active bleeding. On admission 161K  -Monitor CBC -Monitor for bleeding  Hypotension in setting of recent syncope - Resolved.  Etiology likely from pain medications vs hypovolemia.    -1 L NS bolus as needed  Code: Full  DVT Ppx: ASA 325 mg, SCD's  Diet: Regular    Dispo: SNF once medically stable  The patient does not have a current PCP (No primary provider on file.) and does need an [facility] hospital follow-up appointment after discharge.  The patient does have transportation limitations that hinder transportation to clinic  appointments.  .Services Needed at time of discharge: Y = Yes, Blank = No PT:    OT:    RN:    Equipment:  Rolling [place] with 5" wheels  Other:     LOS: 7 days   Juluis Mire, MD 03/07/2014, 12:03 PM

## 2014-03-07 NOTE — Progress Notes (Signed)
Occupational Therapy Treatment Patient Details Name: Michele Johns MRN: [id number] DOB: [birth year] Today's Date: 03/07/2014    History of present illness Pt adm with rt femoral neck fx and syncopal episode. Pt underwent Rt. THA with anterior approach.   OT comments  Pt progressing toward goals and good recall of precautions this session verbally. Pt however continues to need reinforcement for performing ADLs with precautions.    Follow Up Recommendations  SNF    Equipment Recommendations  None recommended by OT    Recommendations for Other Services      Precautions / Restrictions Precautions Precautions: Fall;Anterior Hip Restrictions Weight Bearing Restrictions: Yes RLE Weight Bearing: Partial weight bearing RLE Partial Weight Bearing Percentage or Pounds: 50%       Mobility Bed Mobility Overal bed mobility: Needs Assistance Bed Mobility: Supine to Sit     Supine to sit: Min guard;HOB elevated        Transfers Overall transfer level: Needs assistance Equipment used: Rolling Pinn (2 wheeled) Transfers: Sit to/from Stand Sit to Stand: Min guard              Balance Overall balance assessment: Needs assistance Sitting-balance support: Bilateral upper extremity supported;Feet supported Sitting balance-Leahy Scale: Fair                             ADL Overall ADL's : Needs assistance/impaired     Grooming: Wash/dry face;Min Dispensing optician: Min guard;Ambulation;RW;BSC   Toileting- Water quality scientist and Hygiene: Min guard;Sit to/from stand       Functional mobility during ADLs: Min guard;Rolling Krawczyk General ADL Comments: Pt progressing to EOb this session required HOB elevated and bed rails. pt transfering to 3n1 and then sink level for grooming. Pt ambulating into hallway. PT requires v/c fo rsafety with RW. pt picking up RW to advance [place].      Vision                      Perception     Praxis      Cognition   Behavior During Therapy: WFL for tasks assessed/performed Overall Cognitive Status: Within Functional Limits for tasks assessed                       Extremity/Trunk Assessment               Exercises     Shoulder Instructions       General Comments      Pertinent Vitals/ Pain       None reported VSS  Home Living                                          Prior Functioning/Environment              Frequency Min 2X/week     Progress Toward Goals  OT Goals(current goals can now be found in the care plan section)  Progress towards OT goals: Progressing toward goals;Goals met and updated - see care plan  Acute Rehab OT Goals Patient Stated Goal: to go home OT Goal Formulation: With patient Time For Goal Achievement: 03/16/14 Potential to Achieve Goals: Good ADL Goals Pt Will Perform Grooming: standing;with supervision Pt  Will Perform Upper Body Bathing: sitting;standing;with supervision Pt Will Transfer to Toilet: bedside commode;with supervision Additional ADL Goal #1: pt will demonstrate bed mobiltiy supervision level  Plan Discharge plan remains appropriate    Co-evaluation                 End of Session     Activity Tolerance Patient tolerated treatment well   Patient Left in chair;with call bell/phone within reach;with chair alarm set   Nurse Communication Mobility status;Precautions        Time: 2878-6767 OT Time Calculation (min): 13 min  Charges: OT General Charges $OT Visit: 1 Procedure OT Treatments $Self Care/Home Management : 8-22 mins  Peri Maris 03/07/2014, 1:50 PM Pager: [phone]

## 2014-03-07 NOTE — Consult Note (Signed)
Name: Michele Johns MRN: [id number] DOB: [birth year]    ADMISSION DATE:  02/28/2014 CONSULTATION DATE:  03/07/2014  REFERRING MD :  Graciella Freer PRIMARY SERVICE:  [facility]  CHIEF COMPLAINT:  Abnormal CT scan   HISTORY OF PRESENT ILLNESS:  80 year old remote smoker admitted on 02/28/14 with syncopal episode and a cute right hip pain due to  a right femoral not traumatic fracture. She underwent THR (yates), developed dyspnea, hypoxia and sinus tachycardia postop, CT angiogram chest on 5/13 showed irregular mass in anterior medial left upper lobe measuring 2 x 3.5 cm with 0.3 cm focus of calcification within this mass. There was subcarinal lymphadenopathy measuring 3.2 x 1.8 cm. There is a 1.2 cm lymph node in the right hilum. She also had bilateral moderate pleural effusions, underwent right-sided thoracenteses with removal of 530 cc of clear transudate of fluid, by protein and LDH. We are consulted for further pulmonary management. She's improving with rehabilitation. Currently denies dyspnea or chest. She does not have a PCP and has not seen a doctor in years She smoked for about 5 years in her 84s. She has always lived in [place] :  History reviewed. No pertinent past medical history. Past Surgical History  Procedure Laterality Date  . Total hip arthroplasty Right 03/01/2014    Procedure: TOTAL HIP ARTHROPLASTY ANTERIOR APPROACH;  Surgeon: Marybelle Killings, MD;  Location: [facility];  Service: Orthopedics;  Laterality: Right;   Prior to Admission medications   Medication Sig Start Date End Date Taking? Authorizing Provider  Multiple Vitamin (MULTIVITAMIN WITH MINERALS) TABS tablet Take 1 tablet by mouth daily.   Yes Historical Provider, MD  naproxen sodium (ANAPROX) 220 MG tablet Take 440 mg by mouth daily.   Yes Historical Provider, MD  aspirin EC 325 MG tablet Take 1 tablet (325 mg total) by mouth daily. 03/01/14   Epimenio Foot, PA-C  HYDROcodone-acetaminophen (NORCO) 5-325 MG per  tablet Take 1-2 tablets by mouth every 4 (four) hours as needed for moderate pain. 03/01/14   Epimenio Foot, PA-C   Allergies  Allergen Reactions  . Penicillins Other (See Comments)    Reaction: blisters on leg    FAMILY HISTORY:  No family history on file. SOCIAL HISTORY:  reports that she has never smoked. She does not have any smokeless tobacco history on file. She reports that she does not drink alcohol or use illicit drugs.  REVIEW OF SYSTEMS:   Constitutional: Negative for fever, chills, weight loss, malaise/fatigue and diaphoresis.  HENT: Negative for hearing loss, ear pain, nosebleeds, congestion, sore throat, neck pain, tinnitus and ear discharge.   Eyes: Negative for blurred vision, double vision, photophobia, pain, discharge and redness.  Respiratory: Negative for cough, hemoptysis, sputum production, shortness of breath, wheezing and stridor.   Cardiovascular: Negative for chest pain, palpitations, orthopnea, claudication, leg swelling and PND.  Gastrointestinal: Negative for heartburn, nausea, vomiting, abdominal pain, diarrhea, constipation, blood in stool and melena.  Genitourinary: Negative for dysuria, urgency, frequency, hematuria and flank pain.  Musculoskeletal: Negative for myalgias, back pain, joint pain and falls.  Skin: Negative for itching and rash.  Neurological: Negative for dizziness, tingling, tremors, sensory change, speech change, focal weakness, seizures, loss of consciousness, weakness and headaches.  Endo/Heme/Allergies: Negative for environmental allergies and polydipsia. Does not bruise/bleed easily.  SUBJECTIVE:   VITAL SIGNS: Temp:  [98.4 F (36.9 C)-98.9 F (37.2 C)] 98.9 F (37.2 C) (05/19 1325) Pulse Rate:  [92-108] 92 (05/19 1325) Resp:  [  16-18] 18 (05/19 1507) BP: (121-135)/(41-48) 135/48 mmHg (05/19 1325) SpO2:  [93 %-95 %] 93 % (05/19 1507)  PHYSICAL EXAMINATION: Gen. Pleasant, thin woman, in no distress, normal affect ENT - no  lesions, no post nasal drip Neck: No JVD, no thyromegaly, no carotid bruits Lungs: no use of accessory muscles, no dullness to percussion, clear without rales or rhonchi  Cardiovascular: Rhythm regular, heart sounds  normal, no murmurs, no peripheral edema Abdomen: soft and non-tender, no hepatosplenomegaly, BS normal. Musculoskeletal: No deformities, no cyanosis or clubbing Neuro:  alert, non focal Skin:  Warm, no lesions/ rash    Recent Labs Lab 03/05/14 0615 03/06/14 1010 03/07/14 0540  NA 139 142 145  K 4.5 3.9 3.7  CL 107 103 106  CO2 22 27 28   BUN 16 18 19   CREATININE 0.75 0.79 0.72  GLUCOSE 102* 105* 85    Recent Labs Lab 03/05/14 0615 03/06/14 0324 03/06/14 1016 03/07/14 0540  HGB 7.1* 6.3* 9.6* 8.6*  HCT 20.7* 19.2* 28.0* 25.4*  WBC 8.2 7.8  --  6.8  PLT 178 218  --  277   No results found.  ASSESSMENT / PLAN:  Left upper lobe mass  subcarinal and right hilar lymphadenopathy   Calcification may be indicated for chronic process, alternatively it is also possible that the lung mass has and developed a calcification. Appearance is very suspicious for lung malignancy. Unfortunately no old films are available.  Recommend - PET scan as outpatient once she is improved with rehabilitation. Please arrange on discharge Following this, outpatient appointment was made , if lymph nodes are hypermetabolic we will proceed with sampling them using endobronchial ultrasound. Procedure and her imaging studies were  discussed with the patient and her daughter at the bedside in great detail  Pleural effusion- appears transudative with negative cytology  Okay to discharge from my standpoint  Rigoberto Noel MD  Pulmonary and [place] Pager: [phone]  03/07/2014, 5:14 PM

## 2014-03-07 NOTE — Progress Notes (Signed)
CSW spoke to patient today to give bed offers and she deferred to her daughter Olin Hauser.  Contacted Olin Hauser and she stated that of the many choices she would prefer [facility] as first choice, then [facility] and then [facility]. Patient received many other bed offers that she would consider if the above 3 places were unable to offer.  CSW let message for [place] at [facility]; per nursing- patient is not yet stable for d/c.   CSW will need to follow up tomorrow.  Lorie Phenix. [place], [place]

## 2014-03-07 NOTE — Progress Notes (Signed)
  I have seen and examined the patient, and reviewed the daily progress note by Shirley Muscat, MS3, and discussed the care of the patient with them. Please see my progress note from 03/07/2014 for further details regarding assessment and plan.    Signed:  Juluis Mire, MD 03/07/2014, 6:15 PM

## 2014-03-07 NOTE — Progress Notes (Signed)
  I have seen and examined the patient, and reviewed the daily progress note by Shirley Muscat, [place]  and discussed the care of the patient with them. Please see my progress note from 03/07/2014 for further details regarding assessment and plan.    Signed:  Juluis Mire, MD 03/07/2014, 6:15 PM

## 2014-03-07 NOTE — Progress Notes (Signed)
Subjective:  No acute events overnight. Walked with PT without significant complaints of pain. Patient was seen in the morning. She was being transported back from xray after she refused to get a scan because she did not know why she was getting that xray. This AM with Hg 8.6 which is a drop from 9.6 yesterday s/p blood transfusion. Currently denies dyspnea and is breathing comfortably on room air. No chest pain, weakness, fatigue, lightheadedness. Hip pain well-controlled. Normal urination and BM's. Per nursing note, pt with small ping-pong sized ball in her right lower labia thought to be fluid filled that pt has had for years.    Objective: Vital signs in last 24 hours: Filed Vitals:   03/07/14 0400 03/07/14 0500 03/07/14 0736 03/07/14 1124  BP:  121/41    Pulse:  102    Temp:  98.4 F (36.9 C)    TempSrc:  Oral    Resp: 16 17 17 18   Height:      Weight:      SpO2:  94% 94% 94%   Weight change:   Intake/Output Summary (Last 24 hours) at 03/07/14 1250 Last data filed at 03/07/14 0909  Gross per 24 hour  Intake    360 ml  Output    400 ml  Net    -40 ml   PHYSICAL EXAM:  General: NAD Heart: Normal rate and rhythm. Systolic murmur present  Lungs: Breathing comfortably on 1L [place].  Decreased breath sounds. No wheezing, ronchi, or rales Abdomen: Soft, non-distended, non-tender, normal BS Musculoskeletal: Limited movement of right leg and hip due to pain, no swelling.  Neuro: A & O x 3  Lab Results: Basic Metabolic Panel:  Recent Labs Lab 03/06/14 1010 03/07/14 0540  NA 142 145  K 3.9 3.7  CL 103 106  CO2 27 28  GLUCOSE 105* 85  BUN 18 19  CREATININE 0.79 0.72  CALCIUM 9.2 8.3*   Liver Function Tests:  Recent Labs Lab 02/28/14 1319 03/05/14 1054 03/07/14 0500  AST 29  --  23  ALT 17  --  11  ALKPHOS 83  --  75  BILITOT 0.9  --  0.6  PROT 6.3 5.2* 5.0*  ALBUMIN 3.1*  --  1.7*   CBC:  Recent Labs Lab 03/04/14 0322  03/06/14 0324 03/06/14 1016  03/07/14 0540  WBC 8.6  < > 7.8  --  6.8  NEUTROABS 7.1  --  6.3  --   --   HGB 7.3*  < > 6.3* 9.6* 8.6*  HCT 21.7*  < > 19.2* 28.0* 25.4*  MCV 92.3  < > 91.9  --  90.4  PLT 136*  < > 218  --  277  < > = values in this interval not displayed. Cardiac Enzymes:  Recent Labs Lab 02/28/14 1319 02/28/14 1928 03/01/14 0117  TROPONINI <0.30 <0.30 <0.30   Hemoglobin A1C:  Recent Labs Lab 03/01/14 0117  HGBA1C 5.6    Urinalysis:  Recent Labs Lab 02/28/14 1401 03/07/14 1122  COLORURINE AMBER* YELLOW  LABSPEC 1.042* 1.013  PHURINE 6.5 6.0  GLUCOSEU NEGATIVE NEGATIVE  HGBUR NEGATIVE SMALL*  BILIRUBINUR NEGATIVE NEGATIVE  KETONESUR 15* NEGATIVE  PROTEINUR >300* NEGATIVE  UROBILINOGEN 1.0 0.2  NITRITE NEGATIVE NEGATIVE  LEUKOCYTESUR SMALL* NEGATIVE     Studies/Results: Dg Chest 1 View  03/05/2014   CLINICAL DATA:  Post right thoracentesis  EXAM: CHEST - 1 VIEW  COMPARISON:  US THORACENTESIS ASP PLEURAL SPACE W/IMG GUIDE dated 03/05/2014  FINDINGS: Left apical mass reidentified. Small left pleural effusion is identified. Trace residual right effusion is identified without pneumothorax. There is a skin fold overlying the left hemi thorax, without pneumothorax identified. No mediastinal shift. Chronically prominent interstitial markings are reidentified.  IMPRESSION: No pneumothorax after thoracentesis.  Stable left apical mass.   Electronically Signed   By: Conchita Paris M.D.   On: 03/05/2014 14:02   Medications: I have reviewed the patient's current medications. Scheduled Meds: . aspirin EC  325 mg Oral Q breakfast  . calcium carbonate  1,250 mg Oral Q breakfast  . cholecalciferol  800 Units Oral Daily  . feeding supplement (ENSURE COMPLETE)  237 mL Oral Q1500  . multivitamin with minerals  1 tablet Oral Daily  . sodium chloride  3 mL Intravenous Q12H  . sodium chloride  3 mL Intravenous Q12H   Continuous Infusions:   PRN Meds:.acetaminophen, albuterol, bisacodyl,  docusate sodium, HYDROcodone-acetaminophen, ipratropium-albuterol, menthol-cetylpyridinium, phenol, promethazine, promethazine, senna, senna-docusate, sodium chloride Assessment/Plan:   Symptomatic Acute Anemia - Hg dropped to 8.6 from 9.6 yesterday s/p 1 pRBC, down from 13.8 on admission. Currently not dyspneic or hypoxic. Pt with no active bleeding s/p surgery (minimal blood loss per operative report).  -Monitor CBC -Transfuse if Hg<7  -Monitor for bleeding  -Obtain anemia panel -->retic(wnl), ferritin (127), iron (14 L), TIBC (166 L), TIBC (166 L), Iron Sat (8 L), folate (wnl), B12 (wnl)   -Obtain FOBT  -Consider right hip XR to assess for hematoma - Coagulation studies obtained: PT/INR: 13.8 (nl)  aPTT: 40 (H) - suspicious for von willebrand deficiency. Will wait until morning labs to consider further workup.  Hypoxia - Currently on RA with normal SpO2 .  Etiology most likely multifactorial from symtopmatic anemia, AECHF, left upper lobe lung mass, and possible undiagnosed COPD -Oxygen supplementation to keep SpO2 > 92% -Duonebs Q 6 hr -Albuterol Q 4 hr PRN  -Ambulate with pulse oximetry  Acute on Chronic Diastolic CHF -  2D-echo on 5/18 with EF 65%, mild LVH, and diastolic dysfunction.  Pro-BNP elevated at 5437. Wt gain from admission (100 to 111 lb ?) -Monitor volume status -Monitor daily weight & strict I & O's -IV lasix 20 mg with blood transfusion  Elevated d-dimer - 1.9 on 5/16. CTA chest on 5/16 negative for PE. Etiology likely due to post-surgery and malignancy.  -Obtain b/l Dopplers US --> No DVT  Left Upper Lobe Lung Mass with adnenopathy & b/l moderate layering pleural effusions s/p thoracentesis on 5/17 - CTA chest on 5/13 with irregular mass is in anterior medial left upper lobe measuring 2 x 3.5 cm with 0.3 cm focus of calcification within this mass. Also with subcarinal lymphadenopathy measuring 3.2 x 1.8 cm. There is a 1.2 cm lymph node in the right hilum. Most likely  with NSCLC. Pt past smoker (2 yrs) and second hand exposure -F/U pleural effusion labs --> transudative (0.24 LDH ratio; 0.13 protein ratio) most likely due to AECHF due to IVF resuscitation  -F/U pleural AFB, cytology, culture -Obtain PTH (wnl) and PTHrP in setting of femoral neck fracture  -Outpatient work-up, if family agreeable inpatient pulmonology consult   Acute Nondisplaced Right Femoral Neck Fracture s/p anterior THR on 5/13 - Etiology most likely from undiagnosed osteoporosis due to advanced age vs pathological fracture from malignancy. Vitamin 25 OH level normal (64).  -Appreciate orthopedic recommendations  -Norco/vicodin 1-2 Q 4hr pain - ASA 325 mg daily for 4 weeks & SCD's for DVT prophylaxis  -Continue oscal  1250 mg daily & vitmain D 800 U daily -Consider outpatient DEXA scan & bisphosphonate therapy   -Up with therapy, 50% partial weight bearing. -Orthopedic follow-up in 2 weeks with Dr. Lorin Mercy  -Ice packs PRN pain  -Wound care daily/PRN, to be covered for 2 weeks  -SNF once medically stable   Painless Right Labial Mass - Unclear etiology. Reports present for yrs. -Outpatient work-up  Prolonged QT - 12-lead EKFG on 5/12 with QTc 503. -Avoid QT prolonging medications   Possible Undiagnosed COPD -. CXR on 5/12 with lungs that are mildly hyperinflated with attenuation of the pulmonary markings, compatible with COPD. Pt past smoker (2 yrs) and second hand exposure. -Oxygen supplementation to keep SpO2 > 92% -Duonebs Q 6 hr -Outpatient PFT's  Pyuria without cystitis- Pt with UA on 5/12 with small LE, pyuria (7-10), and bacteruria (many). No leukocytosis. Repeat UA on 5/19 with small Hgb urine dipstick, many yeast, few squamous epithelia, many bacteria and granular cast -Monitor for symptoms  -No antibiotic therapy indicated at this time  Hyperglycemia - A1c 5.7 on 5/13   -Monitor for prediabetes  Nonoliguric AKI  - Resolved. s/p IVF's.  Most likely prerenal azotemia from  hypovolemia from hypotension.  -Monitor BMP in setting of recent contrast -Bladder scan with I & O catherization PRN (>300cc) -Avoid nephrotoxins  Thrombocytopenia - Resolved. Platelet count now normal with no active bleeding. On admission 161K  -Monitor CBC -Monitor for bleeding  Hypotension in setting of recent syncope - Resolved.  Etiology likely from pain medications vs hypovolemia.    -1 L NS bolus as needed  Code: Full  DVT Ppx: ASA 325 mg, SCD's  Diet: Regular    Dispo: SNF once medically stable  The patient does not have a current PCP (No primary provider on file.) and does need an [facility] hospital follow-up appointment after discharge.  The patient does have transportation limitations that hinder transportation to clinic appointments.  .Services Needed at time of discharge: Y = Yes, Blank = No PT: y  OT: y  RN:   Equipment:  Conservation officer, nature with 5" wheels  Other:     LOS: 7 days   Michele Johns, Med Student 03/07/2014, 12:50 PM

## 2014-03-07 NOTE — Progress Notes (Signed)
CSW spoke to daughter over the phone to provide bed offers. Daughter states she would still like to go with [facility] even though it is not a private room. CSW is awaiting to hear back from [facility].  Jeanette Caprice, MSW, [place]

## 2014-03-08 DIAGNOSIS — D649 Anemia, unspecified: Secondary | ICD-10-CM | POA: Diagnosis present

## 2014-03-08 DIAGNOSIS — C3490 Malignant neoplasm of unspecified part of unspecified bronchus or lung: Secondary | ICD-10-CM | POA: Diagnosis present

## 2014-03-08 DIAGNOSIS — J90 Pleural effusion, not elsewhere classified: Secondary | ICD-10-CM | POA: Diagnosis present

## 2014-03-08 LAB — CBC
HCT: 28.7 % — ABNORMAL LOW (ref 36.0–46.0)
HCT: 27.7 % — ABNORMAL LOW (ref 36.0–46.0)
Hemoglobin: 9.5 g/dL — ABNORMAL LOW (ref 12.0–15.0)
Hemoglobin: 9.5 g/dL — ABNORMAL LOW (ref 12.0–15.0)
MCH: 30.7 pg (ref 26.0–34.0)
MCH: 31.1 pg (ref 26.0–34.0)
MCHC: 33.1 g/dL (ref 30.0–36.0)
MCHC: 34.3 g/dL (ref 30.0–36.0)
MCV: 92.9 fL (ref 78.0–100.0)
MCV: 90.8 fL (ref 78.0–100.0)
PLATELETS: 341 10*3/uL (ref 150–400)
PLATELETS: 361 10*3/uL (ref 150–400)
RBC: 3.09 MIL/uL — ABNORMAL LOW (ref 3.87–5.11)
RBC: 3.05 MIL/uL — ABNORMAL LOW (ref 3.87–5.11)
RDW: 14.6 % (ref 11.5–15.5)
RDW: 14.3 % (ref 11.5–15.5)
WBC: 7.9 10*3/uL (ref 4.0–10.5)
WBC: 9.4 10*3/uL (ref 4.0–10.5)

## 2014-03-08 LAB — LACTATE DEHYDROGENASE: LDH: 291 U/L — ABNORMAL HIGH (ref 94–250)

## 2014-03-08 LAB — HAPTOGLOBIN: Haptoglobin: 334 mg/dL — ABNORMAL HIGH (ref 45–215)

## 2014-03-08 LAB — TECHNOLOGIST SMEAR REVIEW

## 2014-03-08 LAB — BASIC METABOLIC PANEL
BUN: 17 mg/dL (ref 6–23)
CALCIUM: 9 mg/dL (ref 8.4–10.5)
CO2: 27 mEq/L (ref 19–32)
CREATININE: 0.68 mg/dL (ref 0.50–1.10)
Chloride: 106 mEq/L (ref 96–112)
GFR calc Af Amer: >90 mL/min (ref >90)
GFR calc non Af Amer: 80 mL/min — ABNORMAL LOW (ref >90)
Glucose, Bld: 106 mg/dL — ABNORMAL HIGH (ref 70–99)
Potassium: 4.7 mEq/L (ref 3.7–5.3)
Sodium: 146 mEq/L (ref 137–147)

## 2014-03-08 LAB — FIBRINOGEN: FIBRINOGEN: 796 mg/dL — AB (ref 204–475)

## 2014-03-08 MED ORDER — CALCIUM CARBONATE 1250 (500 CA) MG PO TABS: 1250.0000 mg | ORAL_TABLET | Freq: Every day | ORAL | Status: AC

## 2014-03-08 MED ORDER — VITAMIN D 400 UNITS PO TABS: 800.0000 [IU] | ORAL_TABLET | Freq: Every day | ORAL | Status: AC

## 2014-03-08 MED ORDER — HYDROCODONE-ACETAMINOPHEN 5-325 MG PO TABS: 1.0000 | ORAL_TABLET | ORAL | Status: DC | PRN

## 2014-03-08 MED ORDER — ASPIRIN EC 325 MG PO TBEC: 325.0000 mg | DELAYED_RELEASE_TABLET | Freq: Every day | ORAL | Status: DC

## 2014-03-08 NOTE — Progress Notes (Signed)
SATURATION QUALIFICATIONS: (This note is used to comply with regulatory documentation for home oxygen)  Patient Saturations on Room Air at Rest = 100%  Patient Saturations on Room Air while Ambulating = 97%  Patient Saturations on 0 Liters of oxygen while Ambulating = 0%  Please briefly explain why patient needs home oxygen:pt sat while ambulating on room air, 97-100%

## 2014-03-08 NOTE — Discharge Summary (Signed)
Name: Michele Johns MRN: [id number] DOB: 07/09/1930 80 y.o. PCP: No primary provider on file.  Date of Admission: 02/28/2014 12:38 PM Date of Discharge: 03/08/2014 Attending Physician: Madilyn Fireman, MD  Discharge Diagnosis:  Primary:  Acute Nondisplaced Right Femoral Nontraumatic Neck Fracture   Symptomatic Acute Anemia Hypoxia  Left Upper Lobe Lung Mass with adnenopathy & transudative pleural effusions Hypotension in setting of recent syncope Acute on Chronic Diastolic CHF Nonoliguric AKI  Elevated d-dimer  Painless Right Labial Mass   Prolonged QT  Possible Undiagnosed COPD  Hyperglycemia Pyuria  Acute Thrombocytopenia  DVT Prophylaxis     Discharge Medications:   Medication List         aspirin EC 325 MG tablet  Take 1 tablet (325 mg total) by mouth daily.     calcium carbonate 1250 MG tablet  Commonly known as:  OS-CAL - dosed in mg of elemental calcium  Take 1 tablet (1,250 mg total) by mouth daily with breakfast.     HYDROcodone-acetaminophen 5-325 MG per tablet  Commonly known as:  NORCO  Take 1-2 tablets by mouth every 4 (four) hours as needed for moderate pain.     multivitamin with minerals Tabs tablet  Take 1 tablet by mouth daily.     naproxen sodium 220 MG tablet  Commonly known as:  ANAPROX  Take 440 mg by mouth daily.     vitamin D (CHOLECALCIFEROL) 400 UNITS tablet  Take 2 tablets (800 Units total) by mouth daily.        Disposition and follow-up:   Michele Johns was discharged from [facility] in Good condition.  At the hospital follow up visit please address:  1.  Right total hip replacement - 50% weight bearing to right lower extremity, aspirin 325 mg daily for 4 weeks, wound care dressing daily/PRN, and pain management. To follow-up with Dr. Lorin Mercy on 03/22/14          Acute anemia s/p surgery and 1pRBC with possible right hip hematoma - monitor CBC (9.5 on 5/20), transfuse if Hg <7, von willebrand panel work-up  pending, repeat imaging for resolution       Osteoporosis - compliance with vitamin D/calcium daily - outpatient f/u (dexa scan)         Left Lung Mass - PET scan scheduled on 03/29/14       Possible undiagnosed COPD - outpatient PFT's        Establishment of PCP and GYN referral for labial cyst (?)         A1c 5.6 - lifestyle modification        Screening colonoscopy    2.  Labs / imaging needed at time of follow-up: CBC (H/H), FOBT, repeat XR right hip for resolution of hematoma    3.  Pending labs/ test needing follow-up: PTHrP, von willebrand panel, pleural ABF & culture final report  Follow-up Appointments: Follow-up Information   Follow up with Rigoberto Noel., MD On 03/29/2014. (4-15 pm after PET scan)    Specialty:  Pulmonary Disease   Contact information:   [address] [address] [phone]       Follow up with Marybelle Killings, MD On 03/22/2014. (10: 52 AM)    Specialty:  Orthopedic Surgery   Contact information:   [place] [address] [phone]       Discharge Instructions:   Consultations:    Procedures Performed:  Dg Chest 1 View  03/05/2014   CLINICAL  DATA:  Post right thoracentesis  EXAM: CHEST - 1 VIEW  COMPARISON:  US THORACENTESIS ASP PLEURAL SPACE W/IMG GUIDE dated 03/05/2014  FINDINGS: Left apical mass reidentified. Small left pleural effusion is identified. Trace residual right effusion is identified without pneumothorax. There is a skin fold overlying the left hemi thorax, without pneumothorax identified. No mediastinal shift. Chronically prominent interstitial markings are reidentified.  IMPRESSION: No pneumothorax after thoracentesis.  Stable left apical mass.   Electronically Signed   By: Conchita Paris M.D.   On: 03/05/2014 14:02   Dg Chest 2 View  02/28/2014   CLINICAL DATA:  Syncope  EXAM: CHEST  2 VIEW  COMPARISON:  None.  FINDINGS: The cardiac and mediastinal silhouettes are within normal limits.  Atherosclerotic calcifications noted within the aortic arch.  The lungs are mildly hyperinflated with attenuation of the pulmonary markings, compatible with COPD. Marland Kitchen No airspace consolidation, pleural effusion, or pulmonary edema is identified. There is no pneumothorax.  No acute osseous abnormality identified.  IMPRESSION: 1. No acute cardiopulmonary abnormality. 2. COPD.   Electronically Signed   By: Jeannine Boga M.D.   On: 02/28/2014 14:23   Dg Hip Complete Right  03/08/2014   CLINICAL DATA:  Anemia. Recent surgery. Possible hematoma. Right anterior/ lateral hip pain.  EXAM: RIGHT HIP - COMPLETE 2+ VIEW  COMPARISON:  Pelvis or year radiographs 02/28/2014  FINDINGS: The bones are diffusely osteopenic. There are postoperative changes of right hip arthroplasty. No hardware complication is identified.  There is an incomplete, slightly displaced fracture of the superior aspect of the greater trochanter.  There are locules of gas adjacent to the proximal femur. There is asymmetric increased density within the soft tissues superior and lateral to the proximal right femur which could reflect hematoma.  IMPRESSION: 1. Slightly displaced fracture of the superior aspect of the greater trochanter. 2. Right hip arthroplasty. 3. Increased density within the soft tissues superior and lateral to the proximal femur could reflect hematoma, given clinical concern for hematoma. 4. Decreased osseous mineralization.   Electronically Signed   By: Curlene Dolphin M.D.   On: 03/08/2014 08:07   Dg Hip Complete Right  02/28/2014   CLINICAL DATA:  Right anterior groin pain radiating distally. Limited range of motion.  EXAM: RIGHT HIP - COMPLETE 2+ VIEW  COMPARISON:  None available.  FINDINGS: There is an acute nondisplaced fracture through the right femoral neck with slight superior subluxation. The femoral head remains normally aligned within the acetabulum. Femoral head height is preserved. Limited views of the left hip are  unremarkable. No pubic dye stasis. SI joints are approximated. Bony pelvis is intact.  Diffuse osteopenia noted. Degenerative changes present within the lower lumbar spine.  IMPRESSION: Acute nondisplaced fracture through the right femoral neck.   Electronically Signed   By: Jeannine Boga M.D.   On: 02/28/2014 14:25   Dg Hip Operative Right  03/01/2014   CLINICAL DATA:  Postop  EXAM: DG OPERATIVE RIGHT HIP  TECHNIQUE: A single spot fluoroscopic AP image of the right hip is submitted.  COMPARISON:  02/28/2014  FINDINGS: Two views of the right hip submitted. There is right hip prosthesis in anatomic alignment.  IMPRESSION: Right hip prosthesis in anatomic alignment.   Electronically Signed   By: Lahoma Crocker M.D.   On: 03/01/2014 18:30   Ct Angio Chest Pe W/cm &/or Wo Cm  03/04/2014   CLINICAL DATA:  Hypotension, hypoxia, recent hip replacement.  EXAM: CT ANGIOGRAPHY CHEST WITH CONTRAST  TECHNIQUE: Multidetector  CT imaging of the chest was performed using the standard protocol during bolus administration of intravenous contrast. Multiplanar CT image reconstructions and MIPs were obtained to evaluate the vascular anatomy.  CONTRAST:  9mL OMNIPAQUE IOHEXOL 350 MG/ML SOLN  COMPARISON:  Chest x-ray Feb 28, 2014  FINDINGS: There is no pulmonary embolus. There is subcarinal lymphadenopathy measuring 3.2 x 1.8 cm. There is a 1.2 cm lymph node in the right hilum. There is an irregular mass is in anterior medial left upper lobe measuring 2 x 3.5 cm. There is a 0.3 cm focus of calcification within this mass. There are moderate bilateral layering pleural effusions with adjacent compression atelectasis of the posterior bilateral lower lobes. There is no focal pneumonia. The heart size is normal. Minimal pericardial effusion is identified. The visualized upper abdominal structures are unremarkable. No definite focal discrete lytic or blastic lesion is identified within the visualized bones.  Review of the MIP images  confirms the above findings.  IMPRESSION: No pulmonary embolus.  Left upper lobe mass with mediastinal and right hilar lymphadenopathy. Findings are suspicious for neoplasm.  Moderate bilateral pleural effusions.   Electronically Signed   By: Abelardo Diesel M.D.   On: 03/04/2014 19:21   US Thoracentesis Asp Pleural Space W/img Guide  03/05/2014   CLINICAL DATA:  Left lung mass; right greater than left pleural effusions, dyspnea. Request is made for diagnostic and therapeutic right thoracentesis.  EXAM: ULTRASOUND GUIDED DIAGNOSTIC AND THERAPEUTIC RIGHT THORACENTESIS  COMPARISON:  None.  PROCEDURE: An ultrasound guided thoracentesis was thoroughly discussed with the patient and questions answered. The benefits, risks, alternatives and complications were also discussed. The patient understands and wishes to proceed with the procedure. Written consent was obtained.  Ultrasound was performed to localize and mark an adequate pocket of fluid in the right chest. The area was then prepped and draped in the normal sterile fashion. 1% Lidocaine was used for local anesthesia. Under ultrasound guidance a 19 gauge Yueh catheter was introduced. Thoracentesis was performed. The catheter was removed and a dressing applied.  Complications:  None.  FINDINGS: A total of approximately 530 cc's of clear, light yellow fluid was removed. The fluid sample wassent for laboratory analysis.  IMPRESSION: Successful ultrasound guided diagnostic and therapeutic right thoracentesis yielding 530 cc's of pleural fluid.  Read by: Rowe Robert ,P.A.-C.   Electronically Signed   By: Maryclare Bean M.D.   On: 03/05/2014 12:42    2D Echo: 03/06/14  Study Conclusions  - Left ventricle: The cavity size was normal. Wall thickness was increased in a pattern of mild LVH. The estimated ejection fraction was 65%. Wall motion was normal; there were no regional wall motion abnormalities. Findings consistent with left ventricular diastolic dysfunction. -  Right ventricle: The cavity size was normal. Systolic function was normal.   Cardiac Cath:  None  Admission HPI: Original Author Juluis Mire, MD  Michele Johns is a 80 year old female with no significant past medical history who presents with right leg/hip pain and episode of LOC that occurred today.   Pt reports that she was in her normal state of health until 2 weeks ago when she began having right leg/hip pain with no falls, injuries, or trauma that was at first relieved with Alieve but later worsened 2 days ago on Mother's Day. She was able to walk at that time. Pt went to [facility] today around noon where she all of a sudden started feeling weak and then passed out while walking  to the facility. She does not remember the exact details, but per daughter was out for about 3 minutes with no reported head injury, seizure activity, tongue biting, or bowel/urinary incontinence. She then awakened without confusion but found to be in significant pain with hypotension (low 80/60) that improved shortly thereafter with rest and oxygen therapy (92% on RA, requiring 2 L O2). She reports she was having good PO intake at home with normal urine output (without urinary symptoms). She denies lightheadedness, dizziness, chest pain, dyspnea, palpitations, focal weakness, paraesthesias, HA, or vision change.  Pt has no prior history of falls, syncope, DVT, or fractures. She has never had BMD scan before and takes multivitamins.   On ED admission pt had BP of 139/54 with normal SpO2 on RA. Currently she reports persistent right hip pain and unable to move her right leg, stand, or walk.    Hospital Course by problem list:  Acute Nondisplaced Right Femoral Nontraumatic Neck Fracture - Pt presented with progressive right hip/leg pain over 2 weeks that worsened 2 days prior to admission without recent trauma or fall. XR of right hip on 5/12 revealed acute nondisplaced fracture through the right  femoral neck with diffuse osteopenia. Etiology most likely from undiagnosed osteoporosis due to advanced age vs pathological fracture from malignancy (no evidence of metastasis). Vitamin 25 OH (64) and PTH (18.3) levels were normal. PTHrP was pending at time of discharge. Pt was started on oscal 1250 mg daily & vitmain D 800 U daily and to have close hospital follow-up for further management. Pt underwent anterior total hip replacement on 5/13 by Dr. Lorin Mercy with minimal blood loss and no complications. Pt's pain was well controlled following surgery with norco/vicodin and was minimal on day of discharge. Due to acute anemia after surgery, XR of right hip on 5/19 revealed increased density within the soft tissues superior and lateral to the proximal femur which could reflect hematoma. Pt received PT during hospitalization who recommended SNF placement. Per orthopedics surgery, pt to have 50% weight bearing to right LE until outpatient appointment on 6/3. Pt received aspirin 325 mg daily and SCD's during hospitalization and instructed to continue daily 325 mg aspirin for 4 weeks per orthopedic surgery recommendations. Pt also to have wound care daily/as needed for 2 weeks. Pt was medically stable on day of discharge to SNF.     Symptomatic Acute Anemia - Pt with Hg on admission of 13.8 that decreased to low of 6.3 on 5/18 requiring 1pRBC blood transfusion that stabilized to 9.5 on day of discharge. Pt had minimal bleeding per operative reports with trace hematuria. Anemia panel on 5/19 (before transfusion) revealed retic(wnl), ferritin (127), iron (14 L), TIBC (166 L), TIBC (166 L), Iron Sat (8 L), folate (wnl), and B12 (wnl). Work-up included PT/PTT (PTT elevated at 40), hemolytic anemia panel (unremarkable), blood smear (polychromasia), and von willebrand panel (pending at time of discharge) as pt reported past history of epistaxis and menorrhagia. XR of right hip on 5/19 revealed increased density within the soft  tissues superior and lateral to the proximal femur which could reflect hematoma. Pt to have close hospital follow-up for CBC monitoring and repeat imaging to ensure resolution of hematoma.   Hypoxia - Pt with hypoxia on room air that required supplemental oxygen during hospitalization after surgery. Etiology most likely multifactorial from symtopmatic anemia, b/l pleural effusion, AECHF, left upper lobe lung mass, and possible undiagnosed COPD. Pt received oxygen supplementation, duonebs, and albuterol nebulizer as needed during hospitalization with  resolution of hypoxia. Pt was able to ambulate on room air with normal oxygen saturation on day of discharge and denied symptoms of dyspnea.     Left Upper Lobe Lung Mass with adnenopathy & transudative pleural effusions-  Pt with past smoking history (5 yrs) and second hand exposure with chronic dry cough. CTA chest on 5/13 to rule of PE revealed an irregular mass  in anterior medial left upper lobe measuring 2 x 3.5 cm with 0.3 cm focus of calcification within this mass. Also with subcarinal lymphadenopathy measuring 3.2 x 1.8 cm and 1.2 cm lymph node in the right hilum. Imaging also revealed moderate bilateral pleural effusions requiring thoracentesis on 5/17 that was transudatative in nature with cytology negative for malignancy. Pleural AFB (NGTD) and cultures were negative to date for growth with final report pending at time of discharge. Pt was seen by pulmonology who scheduled PET scan (03/29/14) with possible EBUS and sampling if lymph nodes hypermetabolic.   Hypotension in setting of recent syncope - Pt with 1st episode of syncope in setting of hypotension (80/60) most likely secondary to hypotension in setting of severe pain from acute right hip fracture and hypovolemia. Pt was monitored on telemetry with no cardiac events. Troponins were negative and there were no ischemic changes on 12-lead EKG. Pt's neurological status was stable during  hospitalization. Pt received IVF administration during hospitalization with normalization of blood pressure and no additional episodes of syncope. CTA chest on 5/16 did not reveal evidence of PE. Pt ambulated with no symptoms of lightheadedness on day of discharge.   Acute on Chronic Diastolic CHF -  Pt with 2D-echo on 5/18 with EF 65%, mild LVH, and diastolic dysfunction. Pro-BNP was elevated at 5437 on 5/18. Pt with mild acute exacerbation secondary to IVF administration for hypotension that shortly later resolved with discontinuation of fluids.      Nonoliguric AKI - Pt with Cr 1.28 above baseline (0.7) most likely prerenal azotemia from hypovolemia from hypotension that normalized after IVF administration. Pt's renal function remained stable after contrast on 03/04/16 (CTA chest). Pt with mild urinary retention s/p surgery that also resolved.   Elevated d-dimer - Pt with elevated d-dimer of 1.9 on 5/16. CTA chest on 5/16 was negative for PE. Etiology likely due to post-surgery and possible malignancy. There was no evidence of DVT on b/l Dopplers US. Pt received lovenox initially (later discontinued), SCD's, and 325 mg aspirin during hospitalization.    Painless Right Labial Mass - Pt with reported painless cystic mass on right labial present for years. Pt declined physical examination of the area. Pt should have outpatient GYN follow-up for further work-up.    Prolonged QT - 12-lead EKG on 5/12 with QTc 503. QT prolonging medications were held during hospitalization. Pt was monitored on telemetry with no cardiac events during hospitalization.    Possible Undiagnosed COPD -. CXR on 5/12 with lungs mildly hyperinflated with attenuation of the pulmonary markings, compatible with COPD. Pt past smoker (5 yrs) with second hand smoke exposure. Pt received oxygen supplementation, duonebs, and albuterol nebulizer as needed during hospitalization. Pt to have outpatient pulmonary function testing.     Hyperglycemia - Pt with A1c of 5.6 on 5/13. Pt to have outpatient monitoring to prevent progression to pre-diabetes/diabetes.    Pyuria - Initial UA on 5/12 with small LE, pyuria (7-10), bacteruria (many) and no leukocytosis that later resolved on UA on 5/19 (no LE and 3-6 WBC's). Pt had no urinary symptoms and did not receive  antibiotic therapy during hospitalization.  Acute Thrombocytopenia - Pt with platelet count (low 115K) below baseline (on admission 161K) that later normalized.   DVT Prophylaxis - Pt received aspirin 325 mg daily and SCD's during hospitalization and instructed to continue daily 325 mg aspirin for 4 weeks per orthopedic surgery recommendations. There was no evidence of DVT on b/l Dopplers US.    Discharge Vitals:   BP 122/54  Pulse 92  Temp(Src) 98.2 F (36.8 C) (Oral)  Resp 16  Ht 5\' 2"  (1.575 m)  Wt 54.4 kg (119 lb 14.9 oz)  BMI 21.93 kg/m2  SpO2 91%  Discharge Labs:  Results for orders placed during the hospital encounter of 02/28/14 (from the past 24 hour(s))  LACTATE DEHYDROGENASE     Status: Abnormal   Collection Time    03/08/14  4:55 AM      Result Value Ref Range   LDH 291 (*) 94 - 250 U/L  TECHNOLOGIST SMEAR REVIEW     Status: None   Collection Time    03/08/14  4:55 AM      Result Value Ref Range   Tech Review POLYCHROMASIA PRESENT    HAPTOGLOBIN     Status: Abnormal   Collection Time    03/08/14  4:55 AM      Result Value Ref Range   Haptoglobin 334 (*) 45 - 215 mg/dL  FIBRINOGEN     Status: Abnormal   Collection Time    03/08/14  4:55 AM      Result Value Ref Range   Fibrinogen 796 (*) 204 - 475 mg/dL  CBC     Status: Abnormal   Collection Time    03/08/14  4:55 AM      Result Value Ref Range   WBC 7.9  4.0 - 10.5 K/uL   RBC 3.09 (*) 3.87 - 5.11 MIL/uL   Hemoglobin 9.5 (*) 12.0 - 15.0 g/dL   HCT 28.7 (*) 36.0 - 46.0 %   MCV 92.9  78.0 - 100.0 fL   MCH 30.7  26.0 - 34.0 pg   MCHC 33.1  30.0 - 36.0 g/dL   RDW 14.6  11.5 -  15.5 %   Platelets 341  150 - 400 K/uL  BASIC METABOLIC PANEL     Status: Abnormal   Collection Time    03/08/14  4:55 AM      Result Value Ref Range   Sodium 146  137 - 147 mEq/L   Potassium 4.7  3.7 - 5.3 mEq/L   Chloride 106  96 - 112 mEq/L   CO2 27  19 - 32 mEq/L   Glucose, Bld 106 (*) 70 - 99 mg/dL   BUN 17  6 - 23 mg/dL   Creatinine, Ser 0.68  0.50 - 1.10 mg/dL   Calcium 9.0  8.4 - 10.5 mg/dL   GFR calc non Af Amer 80 (*) >90 mL/min   GFR calc Af Amer >90  >90 mL/min  CBC     Status: Abnormal   Collection Time    03/08/14 12:05 PM      Result Value Ref Range   WBC 9.4  4.0 - 10.5 K/uL   RBC 3.05 (*) 3.87 - 5.11 MIL/uL   Hemoglobin 9.5 (*) 12.0 - 15.0 g/dL   HCT 27.7 (*) 36.0 - 46.0 %   MCV 90.8  78.0 - 100.0 fL   MCH 31.1  26.0 - 34.0 pg   MCHC 34.3  30.0 - 36.0 g/dL  RDW 14.3  11.5 - 15.5 %   Platelets 361  150 - 400 K/uL    Signed: Juluis Mire, MD 03/08/2014, 1:10 PM   Time Spent on Discharge: 60 minutes Services Ordered on Discharge: SNF Equipment Ordered on Discharge: rolling [place] with 5" wheels

## 2014-03-08 NOTE — Progress Notes (Signed)
Clinical Social Work Department CLINICAL SOCIAL WORK PLACEMENT NOTE 03/08/2014  Patient:  [facility]  Account Number:  [id number] Admit date:  02/28/2014  Clinical Social Worker:  Ky Barban, Latanya Presser  Date/time:  03/03/2014 01:57 PM  Clinical Social Work is seeking post-discharge placement for this patient at the following level of care:   SKILLED NURSING   (*CSW will update this form in Epic as items are completed)     Patient/family provided with [place] Department of Clinical Social Work's list of facilities offering this level of care within the geographic area requested by the patient (or if unable, by the patient's family).  03/03/2014  Patient/family informed of their freedom to choose among providers that offer the needed level of care, that participate in Medicare, Medicaid or managed care program needed by the patient, have an available bed and are willing to accept the patient.    Patient/family informed of [facility]' ownership interest in [facility], as well as of the fact that they are under no obligation to receive care at this facility.  PASARR submitted to EDS on 03/03/2014 PASARR number received from EDS on 03/03/2014  FL2 transmitted to all facilities in geographic area requested by pt/family on  03/03/2014 FL2 transmitted to all facilities within larger geographic area on   Patient informed that his/her managed care company has contracts with or will negotiate with  certain facilities, including the following:     Patient/family informed of bed offers received:  03/07/2014 Patient chooses bed at [facility] Physician recommends and patient chooses bed at    Patient to be transferred to [place] on  03/08/2014 Patient to be transferred to facility by EMS  The following physician request were entered in Epic:   Additional Comments:  Jeanette Caprice, MSW, [place]

## 2014-03-08 NOTE — Progress Notes (Signed)
Subjective:  No acute events overnight. Xray was taken last night. Patient does not report any pain nor is she taking any pain medication for her leg. She reports that she was able to work with PT and has motivation to walk more today than before. Was pleased to hear that her hemoglobin was stabilizing with an upward trend and was informed that we are working up a possible inherited bleeding disorder. No dyspnea, lightheadedness,  chest pain, nausea, vomiting, abdominal pain, leg swelling, change in BM or urination. Daughter was present during the pulmonology consult and they are both in agreement with their assessment and plan.   Objective: Vital signs in last 24 hours: Filed Vitals:   03/08/14 0439 03/08/14 0753 03/08/14 0900 03/08/14 1101  BP: 122/54     Pulse: 92     Temp: 98.2 F (36.8 C)     TempSrc: Oral     Resp: 16 16  16   Height:      Weight: 54.4 kg (119 lb 14.9 oz)     SpO2: 92% 92% 91% 91%   Weight change:   Intake/Output Summary (Last 24 hours) at 03/08/14 1113 Last data filed at 03/08/14 1048  Gross per 24 hour  Intake    480 ml  Output    350 ml  Net    130 ml   PHYSICAL EXAM:  General: NAD Heart: Normal rate and rhythm. Systolic murmur present  Lungs: Breathing comfortably on RA.  Decreased breath sounds. No wheezing, ronchi, or rales Abdomen: Soft, non-distended, non-tender, normal BS Musculoskeletal: Limited movement of right leg and hip due to pain, no swelling.  Neuro: A & O x 3  Lab Results: Basic Metabolic Panel:  Recent Labs Lab 03/07/14 0540 03/08/14 0455  NA 145 146  K 3.7 4.7  CL 106 106  CO2 28 27  GLUCOSE 85 106*  BUN 19 17  CREATININE 0.72 0.68  CALCIUM 8.3* 9.0   Liver Function Tests:  Recent Labs Lab 03/05/14 1054 03/07/14 0500  AST  --  23  ALT  --  11  ALKPHOS  --  75  BILITOT  --  0.6  PROT 5.2* 5.0*  ALBUMIN  --  1.7*   CBC:  Recent Labs Lab 03/04/14 0322  03/06/14 0324  03/07/14 0540 03/08/14 0455  WBC 8.6   < > 7.8  --  6.8 7.9  NEUTROABS 7.1  --  6.3  --   --   --   HGB 7.3*  < > 6.3*  < > 8.6* 9.5*  HCT 21.7*  < > 19.2*  < > 25.4* 28.7*  MCV 92.3  < > 91.9  --  90.4 92.9  PLT 136*  < > 218  --  277 341  < > = values in this interval not displayed. Cardiac Enzymes: No results found for this basename: CKTOTAL, CKMB, CKMBINDEX, TROPONINI,  in the last 168 hours Hemoglobin A1C: No results found for this basename: HGBA1C,  in the last 168 hours  Urinalysis:  Recent Labs Lab 03/07/14 1122  COLORURINE YELLOW  LABSPEC 1.013  PHURINE 6.0  [place]  UROBILINOGEN 0.2  NITRITE NEGATIVE  LEUKOCYTESUR NEGATIVE     Studies/Results: Dg Hip Complete Right  03/08/2014   CLINICAL DATA:  Anemia. Recent surgery. Possible hematoma. Right anterior/ lateral hip pain.  EXAM: RIGHT HIP - COMPLETE 2+ VIEW  COMPARISON:  Pelvis or year radiographs 02/28/2014  FINDINGS: The bones are diffusely osteopenic. There are postoperative changes of right hip arthroplasty. No hardware complication is identified.  There is an incomplete, slightly displaced fracture of the superior aspect of the greater trochanter.  There are locules of gas adjacent to the proximal femur. There is asymmetric increased density within the soft tissues superior and lateral to the proximal right femur which could reflect hematoma.  IMPRESSION: 1. Slightly displaced fracture of the superior aspect of the greater trochanter. 2. Right hip arthroplasty. 3. Increased density within the soft tissues superior and lateral to the proximal femur could reflect hematoma, given clinical concern for hematoma. 4. Decreased osseous mineralization.   Electronically Signed   By: Curlene Dolphin M.D.   On: 03/08/2014 08:07   Medications: I have reviewed the patient's current medications. Scheduled Meds: . aspirin EC  325 mg Oral Q breakfast  . calcium carbonate  1,250 mg Oral Q  breakfast  . cholecalciferol  800 Units Oral Daily  . feeding supplement (ENSURE COMPLETE)  237 mL Oral Q1500  . multivitamin with minerals  1 tablet Oral Daily  . sodium chloride  3 mL Intravenous Q12H  . sodium chloride  3 mL Intravenous Q12H   Continuous Infusions:   PRN Meds:.acetaminophen, albuterol, bisacodyl, docusate sodium, HYDROcodone-acetaminophen, ipratropium-albuterol, menthol-cetylpyridinium, phenol, promethazine, promethazine, senna, senna-docusate, sodium chloride Assessment/Plan:   Symptomatic Acute Anemia - Currently asymptomatic.  Hg 9.5, improved s/p 1 pRBC on 5/18 due to Hg 6.3, below Hg of 13.8 on admission with no active bleeding or hemodynamic instability. Anemia panel on 5/19 with retic(wnl), ferritin (127), iron (14 L), TIBC (166 L), TIBC (166 L), Iron Sat (8 L), folate (wnl), B12 (wnl)  Pt with no reported active bleeding s/p surgery (minimal blood loss per operative report). Possibly due to femoral neck fracture vs inherited bleeding disorder (reported epistaxis as child and menorrhagia)  -Monitor CBC -Transfuse if Hg<7  -Monitor for bleeding  -Obtain FOBT & UA (small Hg with 0-2 RBC's) -Obtain PT/PTT (40 H),  hemolytic anemia workup and von willebrand panel   - Hemolytic anemia workup: increased haptoglobin, normal indirect bilirubin -- not concerning for hemolysis -Right hip XR to assess for hematoma - increased density within the soft tissues superior and lateral to the proximal femur could reflect hematoma, given clinical concern for hematoma. - talk to orthopedics for recommendation  Hypoxia - Resolved. Currently with normal SpO2 on RA. Etiology most likely multifactorial from symtopmatic anemia, b/l pleural effusion,  AECHF, left upper lobe lung mass, and possible undiagnosed COPD. -Oxygen supplementation to keep SpO2 > 92% -Duonebs Q 4 hr PRN -Albuterol nebulizer Q 4 hr PRN  -Ambulate with pulse oximetry  Left Upper Lobe Lung Mass with adnenopathy & b/l  moderate layering transudative pleural effusions s/p thoracentesis on 5/17 - CTA chest on 5/13 with irregular mass is in anterior medial left upper lobe measuring 2 x 3.5 cm with 0.3 cm focus of calcification within this mass. Also with subcarinal lymphadenopathy measuring 3.2 x 1.8 cm. There is a 1.2 cm lymph node in the right hilum. Most likely with NSCLC. Pt past smoker (5 yrs) and second hand exposure -F/U pleural AFB (NGTD), cytology (no malignant cells), culture (NGTD)--> -F/U PTHrP in setting of femoral neck fracture  -Obtain inpatient pulmonology consult --> recommend PET scan after rehab with possible EBUS and sampling if lymph nodes hypermetabolic  -Outpatient appointment scheduled 03/29/14 with Dr. Elsworth Soho  Acute Nondisplaced Right Femoral Nontraumatic Neck Fracture s/p anterior THR on 5/13 - Currently with  minimal pain. Etiology most likely from undiagnosed osteoporosis due to advanced age vs pathological fracture from malignancy. Vitamin 25 OH level normal (64).  -Appreciate orthopedic recommendations  -Norco/vicodin 1-2 Q 4hr pain - ASA 325 mg daily for 4 weeks & SCD's for DVT prophylaxis  -Continue oscal 1250 mg daily & vitmain D 800 U daily -Consider outpatient DEXA scan & bisphosphonate therapy   -Up with therapy, 50% partial weight bearing. -Orthopedic follow-up in 2 weeks with Dr. Lorin Mercy  -Ice packs PRN pain  -Wound care daily/PRN, to be covered for 2 weeks  -SNF once medically stable   Acute on Chronic Diastolic CHF -  Resolved.  2D-echo on 5/18 with EF 65%, mild LVH, and diastolic dysfunction.  Pro-BNP elevated at 5437. Wt gain from admission (100 to 111 lb ?) -Monitor volume status -Monitor daily weight & strict I & O's  Elevated d-dimer - 1.9 on 5/16. CTA chest on 5/16 negative for PE. Etiology likely due to post-surgery and malignancy. No evidence of DVT on b/l Dopplers US.  Painless Right Labial Mass - Unclear etiology, most likely benign cyst. Reports present for yrs.  Declined physical examination.  -Outpatient GYN follow-up   Prolonged QT - 12-lead EKFG on 5/12 with QTc 503. -Avoid QT prolonging medications   Possible Undiagnosed COPD -. CXR on 5/12 with lungs that are mildly hyperinflated with attenuation of the pulmonary markings, compatible with COPD. Pt past smoker (2 yrs) and second hand exposure. -Oxygen supplementation to keep SpO2 > 92% -Duonebs Q 4 hr PRN -Albuterol nebulizer Q 4 hr PRN -Outpatient PFT's  Pyuria without cystitis- Pt with UA on 5/12 with small LE, pyuria (7-10), and bacteruria (many). No leukocytosis.  -Monitor for symptoms  -No antibiotic therapy indicated at this time  Hyperglycemia - A1c 5.6 on 5/13   -Outpatient monitoring of pre-diabetes  Nonoliguric AKI  - Resolved. s/p IVF's.  Most likely prerenal azotemia from hypovolemia from hypotension.  -Monitor BMP in setting of recent contrast -Bladder scan with I & O catherization PRN if >300cc) -Avoid nephrotoxins  Thrombocytopenia - Resolved. Platelet count now normal with no active bleeding. On admission 161K  -Monitor CBC -Monitor for bleeding  Hypotension in setting of recent syncope - Resolved.  Etiology likely from pain medications vs hypovolemia.    -1 L NS bolus as needed  Code: Full  DVT Ppx: ASA 325 mg, SCD's  Diet: Regular    Dispo: SNF once medically stable  The patient does not have a current PCP (No primary provider on file.) and does need an [facility] hospital follow-up appointment after discharge.  The patient does have transportation limitations that hinder transportation to clinic appointments.  .Services Needed at time of discharge: Y = Yes, Blank = No PT:    OT:    RN:    Equipment:  Rolling [place] with 5" wheels  Other:     LOS: 8 days   Michele Johns, Med Student 03/08/2014, 11:13 AM

## 2014-03-08 NOTE — Progress Notes (Signed)
Physical Therapy Treatment Patient Details Name: Michele Johns MRN: [id number] DOB: [birth year] Today's Date: 03/08/2014    History of Present Illness Pt adm with rt femoral neck fx and syncopal episode. Pt underwent Rt. THA with anterior approach.    PT Comments    Pt has progressed very well since last session, ambulated 175' with RW and supervision. Continue to recommend SNF for rehab at d/c and PT will continue to follow.  Follow Up Recommendations  SNF     Equipment Recommendations  Rolling [place] with 5" wheels    Recommendations for Other Services       Precautions / Restrictions Precautions Precautions: Fall;Anterior Hip Precaution Comments: pt able to verbalize 3/3 precautions but still needs vc's to keep with functional mobility Restrictions Weight Bearing Restrictions: Yes RLE Weight Bearing: Partial weight bearing RLE Partial Weight Bearing Percentage or Pounds: 50%    Mobility  Bed Mobility               General bed mobility comments: pt sitting EOB  Transfers Overall transfer level: Needs assistance Equipment used: Rolling [place] (2 wheeled) Transfers: Sit to/from Stand Sit to Stand: Supervision         General transfer comment: cues for upright posture but pt with safe placement of hands today  Ambulation/Gait Ambulation/Gait assistance: Supervision Ambulation Distance (Feet): 175 Feet Assistive device: Rolling [place] (2 wheeled) Gait Pattern/deviations: Step-to pattern;Decreased stance time - right;Decreased step length - right;Decreased weight shift to right;Trunk flexed Gait velocity: decreased   General Gait Details: pt able to verbalize 50% WB status and is keeping with ambulation. Pt with much improved activity tolerance today, ambulated full distance without rest break. vc's for upright posture   Stairs            Wheelchair Mobility    Modified Rankin (Stroke Patients Only)       Balance Overall balance assessment:  Needs assistance;History of Falls Sitting-balance support: Feet supported;No upper extremity supported Sitting balance-Leahy Scale: Good     Standing balance support: No upper extremity supported Standing balance-Leahy Scale: Fair Standing balance comment: can safely maintain standing for only short period without UE support, and requires UE support for dynamic activity                    Cognition Arousal/Alertness: Awake/alert Behavior During Therapy: WFL for tasks assessed/performed Overall Cognitive Status: Within Functional Limits for tasks assessed                      Exercises Total Joint Exercises Ankle Circles/Pumps: AROM;Both;10 reps;Seated Quad Sets: AROM;Right;10 reps Hip ABduction/ADduction: AAROM;Right;10 reps;Seated Long Arc Quad: AROM;15 reps;Seated;Right    General Comments        Pertinent Vitals/Pain 5/10 RLE pain, leg elevated end of session    Home Living                      Prior Function            PT Goals (current goals can now be found in the care plan section) Acute Rehab PT Goals Patient Stated Goal: to go home PT Goal Formulation: With patient Time For Goal Achievement: 03/09/14 Potential to Achieve Goals: Good Progress towards PT goals: Progressing toward goals    Frequency  Min 3X/week    PT Plan Current plan remains appropriate    Co-evaluation             End of Session Equipment Utilized  During Treatment: Gait belt Activity Tolerance: Patient tolerated treatment well Patient left: in chair;with call bell/phone within reach     Time: 9147-8295 PT Time Calculation (min): 12 min  Charges:  $Gait Training: 8-22 mins                    G Codes:     Leighton Roach, PT  Acute Rehab Services  Pierre Part 03/08/2014, 3:20 PM

## 2014-03-08 NOTE — Progress Notes (Addendum)
Subjective:  Pt seen and examined in AM. No acute events overnight. Reports no dyspnea, lightheadedness, right hip pain, or GI/urinary bleeding.    Objective: Vital signs in last 24 hours: Filed Vitals:   03/08/14 0000 03/08/14 0400 03/08/14 0439 03/08/14 0753  BP:   122/54   Pulse:   92   Temp:   98.2 F (36.8 C)   TempSrc:   Oral   Resp: 14 16 16 16   Height:      Weight:   54.4 kg (119 lb 14.9 oz)   SpO2: 94% 92% 92% 92%   Weight change:   Intake/Output Summary (Last 24 hours) at 03/08/14 0802 Last data filed at 03/07/14 1700  Gross per 24 hour  Intake    480 ml  Output      0 ml  Net    480 ml   PHYSICAL EXAM:  General: NAD Heart: Normal rate and rhythm. Systolic murmur present  Lungs: Breathing comfortably on RA.  Decreased breath sounds. No wheezing, ronchi, or rales Abdomen: Soft, non-distended, non-tender, normal BS Musculoskeletal: Limited movement of right leg and hip due to pain, no swelling. Right hip with very mild tenderness to palpation.  Neuro: A & O x 3  Lab Results: Basic Metabolic Panel:  Recent Labs Lab 03/07/14 0540 03/08/14 0455  NA 145 146  K 3.7 4.7  CL 106 106  CO2 28 27  GLUCOSE 85 106*  BUN 19 17  CREATININE 0.72 0.68  CALCIUM 8.3* 9.0   Liver Function Tests:  Recent Labs Lab 03/05/14 1054 03/07/14 0500  AST  --  23  ALT  --  11  ALKPHOS  --  75  BILITOT  --  0.6  PROT 5.2* 5.0*  ALBUMIN  --  1.7*   CBC:  Recent Labs Lab 03/04/14 0322  03/06/14 0324  03/07/14 0540 03/08/14 0455  WBC 8.6  < > 7.8  --  6.8 7.9  NEUTROABS 7.1  --  6.3  --   --   --   HGB 7.3*  < > 6.3*  < > 8.6* 9.5*  HCT 21.7*  < > 19.2*  < > 25.4* 28.7*  MCV 92.3  < > 91.9  --  90.4 92.9  PLT 136*  < > 218  --  277 341  < > = values in this interval not displayed. Cardiac Enzymes: No results found for this basename: CKTOTAL, CKMB, CKMBINDEX, TROPONINI,  in the last 168 hours Hemoglobin A1C: No results found for this basename:  HGBA1C,  in the last 168 hours  Urinalysis:  Recent Labs Lab 03/07/14 1122  COLORURINE YELLOW  LABSPEC 1.013  PHURINE 6.0  GLUCOSEU NEGATIVE  HGBUR SMALL*  BILIRUBINUR NEGATIVE  KETONESUR NEGATIVE  PROTEINUR NEGATIVE  UROBILINOGEN 0.2  NITRITE NEGATIVE  LEUKOCYTESUR NEGATIVE     Studies/Results: No results found. Medications: I have reviewed the patient's current medications. Scheduled Meds: . aspirin EC  325 mg Oral Q breakfast  . calcium carbonate  1,250 mg Oral Q breakfast  . cholecalciferol  800 Units Oral Daily  . feeding supplement (ENSURE COMPLETE)  237 mL Oral Q1500  . multivitamin with minerals  1 tablet Oral Daily  . sodium chloride  3 mL Intravenous Q12H  . sodium chloride  3 mL Intravenous Q12H   Continuous Infusions:   PRN Meds:.acetaminophen, albuterol, bisacodyl, docusate sodium, HYDROcodone-acetaminophen, ipratropium-albuterol, menthol-cetylpyridinium, phenol, promethazine, promethazine, senna, senna-docusate, sodium chloride Assessment/Plan:   Symptomatic  Acute Anemia - Currently asymptomatic.Hg improved to 9.5, improved s/p 1 pRBC on 5/18 due to Hg 6.3, below Hg of 13.8 on admission with no active bleeding or hemodynamic instability. Anemia panel on 5/19 with retic(wnl), ferritin (127), iron (14 L), TIBC (166 L), TIBC (166 L), Iron Sat (8 L), folate (wnl), B12 (wnl)  Pt with no reported active bleeding s/p surgery (minimal blood loss per operative report). Possibly due to hematoma in setting of undiagnosed bleeding disorder (reported epistaxis as child and menorrhagia)  -Monitor CBC -Transfuse if Hg<7  -Monitor for bleeding  -Obtain PT/PTT (40 H),  hemolytic anemia workup (unremarkable) and von willebrand panel   -Obtain right hip XR to assess for hematoma --> increased density within the soft tissues superior and lateral to the proximal femur could reflect hematoma   Acute Nondisplaced Right Femoral Nontraumatic Neck Fracture s/p anterior THR on 5/13  with possible hematoma- Currently with minimal pain. Etiology most likely from undiagnosed osteoporosis due to advanced age vs pathological fracture from malignancy. Vitamin 25 OH level normal (64).  -Appreciate orthopedic recommendations --> to re-evaluate pt today due to concern for hematoma -Norco/vicodin 1-2 Q 4hr pain - ASA 325 mg daily for 4 weeks & SCD's for DVT prophylaxis  -Continue oscal 1250 mg daily & vitmain D 800 U daily -Consider outpatient DEXA scan & bisphosphonate therapy   -Up with therapy, 50% partial weight bearing. -Orthopedic follow-up scheduled on 6/3/ with Dr. Lorin Mercy  -Ice packs PRN pain  -Wound care daily/PRN, to be covered for 2 weeks  -SNF once medically stable   Hypoxia - Resolved. Currently with normal SpO2 on RA. Etiology most likely multifactorial from symtopmatic anemia, b/l pleural effusion,  AECHF, left upper lobe lung mass, and possible undiagnosed COPD. -Oxygen supplementation to keep SpO2 > 92% -Duonebs Q 4 hr PRN -Albuterol nebulizer Q 4 hr PRN  -Ambulate with pulse oximetry  Left Upper Lobe Lung Mass with adnenopathy & b/l moderate layering transudative pleural effusions s/p thoracentesis on 5/17 - CTA chest on 5/13 with irregular mass is in anterior medial left upper lobe measuring 2 x 3.5 cm with 0.3 cm focus of calcification within this mass. Also with subcarinal lymphadenopathy measuring 3.2 x 1.8 cm. There is a 1.2 cm lymph node in the right hilum. Most likely with NSCLC. Pt past smoker (5 yrs) and second hand exposure -F/U pleural AFB (NGTD) and culture (NGTD) -F/U PTHrP in setting of femoral neck fracture  -Obtain inpatient pulmonology consult --> recommend PET scan after rehab with possible EBUS and sampling if lymph nodes hypermetabolic  -Outpatient appointment scheduled 03/29/14 with Dr. Elsworth Soho  Acute on Chronic Diastolic CHF -  Resolved.  2D-echo on 5/18 with EF 65%, mild LVH, and diastolic dysfunction.  Pro-BNP elevated at 5437. Wt gain from  admission (100 to 111 lb ?) -Monitor volume status -Monitor daily weight & strict I & O's  Elevated d-dimer - 1.9 on 5/16. CTA chest on 5/16 negative for PE. Etiology likely due to post-surgery and malignancy. No evidence of DVT on b/l Dopplers US.  Painless Right Labial Mass - Unclear etiology, most likely benign cyst. Reports present for yrs. Declined physical examination.  -Outpatient GYN follow-up   Prolonged QT - 12-lead EKFG on 5/12 with QTc 503. -Avoid QT prolonging medications   Possible Undiagnosed COPD -. CXR on 5/12 with lungs that are mildly hyperinflated with attenuation of the pulmonary markings, compatible with COPD. Pt past smoker (2 yrs) and second hand exposure. -Oxygen supplementation to keep  SpO2 > 92% -Duonebs Q 4 hr PRN -Albuterol nebulizer Q 4 hr PRN -Outpatient PFT's  Hyperglycemia - A1c 5.6 on 5/13   -Outpatient monitoring of pre-diabetes  Pyuria -  Resolved. UA on 5/19 without LE (3-6 WBC's). Pt with UA on 5/12 with small LE, pyuria (7-10), and bacteruria (many). No leukocytosis.  -Monitor for symptoms  -No antibiotic therapy indicated at this time  Nonoliguric AKI  - Resolved. s/p IVF's.  Most likely prerenal azotemia from hypovolemia from hypotension.  -Monitor BMP in setting of recent contrast -Bladder scan with I & O catherization PRN if >300cc) -Avoid nephrotoxins  Thrombocytopenia - Resolved. Platelet count now normal with no active bleeding. On admission 161K  -Monitor CBC -Monitor for bleeding  Hypotension in setting of recent syncope - Resolved.  Etiology likely from pain medications vs hypovolemia.    -1 L NS bolus as needed  Code: Full  DVT Ppx: ASA 325 mg, SCD's  Diet: Regular    Dispo: SNF once medically stable  The patient does not have a current PCP (No primary provider on file.) and does need an [facility] hospital follow-up appointment after discharge.  The patient does have transportation limitations that hinder transportation to  clinic appointments.  .Services Needed at time of discharge: Y = Yes, Blank = No PT:    OT:    RN:    Equipment:  Rolling [place] with 5" wheels  Other:     LOS: 8 days   Juluis Mire, MD 03/08/2014, 8:02 AM

## 2014-03-08 NOTE — Progress Notes (Signed)
[facility] called for NH transport.  Daughter/pt both informed and agreeable.

## 2014-03-08 NOTE — Progress Notes (Addendum)
Subjective: 7 Days Post-Op Procedure(s) (LRB): TOTAL HIP ARTHROPLASTY ANTERIOR APPROACH (Right) Patient reports pain as mild.    Objective: Vital signs in last 24 hours: Temp:  [98.2 F (36.8 C)-98.9 F (37.2 C)] 98.2 F (36.8 C) (05/20 0439) Pulse Rate:  [92-97] 92 (05/20 0439) Resp:  [14-18] 16 (05/20 1101) BP: (122-135)/(48-62) 122/54 mmHg (05/20 0439) SpO2:  [91 %-94 %] 91 % (05/20 1101) Weight:  [54.4 kg (119 lb 14.9 oz)] 54.4 kg (119 lb 14.9 oz) (05/20 0439)  Intake/Output from previous day: 05/19 0701 - 05/20 0700 In: 480 [P.O.:480] Out: -  Intake/Output this shift: Total I/O In: 240 [P.O.:240] Out: 350 [Urine:350]   Recent Labs  03/06/14 0324 03/06/14 1016 03/07/14 0540 03/08/14 0455  HGB 6.3* 9.6* 8.6* 9.5*    Recent Labs  03/07/14 0540 03/08/14 0455  WBC 6.8 7.9  RBC 2.81* 3.09*  HCT 25.4* 28.7*  PLT 277 341    Recent Labs  03/07/14 0540 03/08/14 0455  NA 145 146  K 3.7 4.7  CL 106 106  CO2 28 27  BUN 19 17  CREATININE 0.72 0.68  GLUCOSE 85 106*  CALCIUM 8.3* 9.0    Recent Labs  03/07/14 1230  INR 1.08    Neurologically intact  Hip anterior incision  Looks good.  Minimal swelling.   Assessment/Plan: 7 Days Post-Op Procedure(s) (LRB): TOTAL HIP ARTHROPLASTY ANTERIOR APPROACH (Right) Up with therapy  SNF  rov 03/22/14  Still on  50 % WB until office follow up and xrays.   Marybelle Killings 03/08/2014, 12:42 PM

## 2014-03-08 NOTE — Progress Notes (Signed)
Per MD, patient is being discharged today to [facility]. CSW made the patient, snf and patient's daughter aware of dc today.   Jeanette Caprice, MSW, [place]

## 2014-03-09 ENCOUNTER — Telehealth: Payer: Self-pay | Admitting: Pulmonary Disease

## 2014-03-09 DIAGNOSIS — R918 Other nonspecific abnormal finding of lung field: Secondary | ICD-10-CM

## 2014-03-09 LAB — BODY FLUID CULTURE
Culture: NO GROWTH
Gram Stain: NONE SEEN

## 2014-03-09 NOTE — Telephone Encounter (Signed)
Called and spoke with RN supervisor at [facility] and she stated that the pts dc papers stated that the pt was to have PET scan prior to her appt with RA on 03/29/2014.  PET scan has not been scheduled for the pt.  RA is this ok to order for her to have prior to her appt with you.  Please advise. Thanks  Allergies  Allergen Reactions  . Penicillins Other (See Comments)    Reaction: blisters on leg     Current Outpatient Prescriptions on File Prior to Visit  Medication Sig Dispense Refill  . aspirin EC 325 MG tablet Take 1 tablet (325 mg total) by mouth daily.  30 tablet  0  . calcium carbonate (OS-CAL - DOSED IN MG OF ELEMENTAL CALCIUM) 1250 MG tablet Take 1 tablet (1,250 mg total) by mouth daily with breakfast.      . cholecalciferol 400 UNITS tablet Take 2 tablets (800 Units total) by mouth daily.  30 each  0  . HYDROcodone-acetaminophen (NORCO) 5-325 MG per tablet Take 1-2 tablets by mouth every 4 (four) hours as needed for moderate pain.  30 tablet  0  . Multiple Vitamin (MULTIVITAMIN WITH MINERALS) TABS tablet Take 1 tablet by mouth daily.      . naproxen sodium (ANAPROX) 220 MG tablet Take 440 mg by mouth daily.       No current facility-administered medications on file prior to visit.

## 2014-03-09 NOTE — Telephone Encounter (Signed)
Ok to schedule.

## 2014-03-09 NOTE — Progress Notes (Signed)
  I have seen and examined the patient, and reviewed the daily progress note by Jori Moll L. Hull,  [place] [facility] and discussed the care of the patient with them. Please see my progress note from 03/09/2014 for further details regarding assessment and plan.    Signed:  Juluis Mire, MD 03/09/2014, 10:48 PM

## 2014-03-09 NOTE — Discharge Summary (Signed)
Reviewed. Agree with documentation. 

## 2014-03-10 LAB — VON WILLEBRAND PANEL
Coagulation Factor VIII: 112 % (ref 73–140)
Ristocetin Co-factor, Plasma: 118 % (ref 42–200)
Von Willebrand Antigen, Plasma: 164 % (ref 50–217)

## 2014-03-10 LAB — PTH-RELATED PEPTIDE: PTH-RELATED PEPTIDE: 11 pg/mL — AB (ref 14–27)

## 2014-03-10 NOTE — Telephone Encounter (Signed)
Pet scan order has been placed LMTCB x1 for RN supervisor at [facility]

## 2014-03-14 ENCOUNTER — Ambulatory Visit (HOSPITAL_COMMUNITY)
Admission: RE | Admit: 2014-03-14 | Discharge: 2014-03-14 | Disposition: A | Discharge status: Home / Self Care | Payer: Medicare Other | Source: Ambulatory Visit | Attending: Pulmonary Disease | Admitting: Pulmonary Disease

## 2014-03-14 DIAGNOSIS — C3490 Malignant neoplasm of unspecified part of unspecified bronchus or lung: Secondary | ICD-10-CM | POA: Diagnosis not present

## 2014-03-14 DIAGNOSIS — J90 Pleural effusion, not elsewhere classified: Secondary | ICD-10-CM | POA: Insufficient documentation

## 2014-03-14 DIAGNOSIS — R222 Localized swelling, mass and lump, trunk: Secondary | ICD-10-CM | POA: Diagnosis present

## 2014-03-14 DIAGNOSIS — R599 Enlarged lymph nodes, unspecified: Secondary | ICD-10-CM | POA: Diagnosis not present

## 2014-03-14 DIAGNOSIS — Z96649 Presence of unspecified artificial hip joint: Secondary | ICD-10-CM | POA: Diagnosis not present

## 2014-03-14 DIAGNOSIS — IMO0002 Reserved for concepts with insufficient information to code with codable children: Secondary | ICD-10-CM | POA: Insufficient documentation

## 2014-03-14 DIAGNOSIS — R918 Other nonspecific abnormal finding of lung field: Secondary | ICD-10-CM

## 2014-03-14 DIAGNOSIS — Y849 Medical procedure, unspecified as the cause of abnormal reaction of the patient, or of later complication, without mention of misadventure at the time of the procedure: Secondary | ICD-10-CM | POA: Diagnosis not present

## 2014-03-14 LAB — GLUCOSE, CAPILLARY: Glucose-Capillary: 111 mg/dL — ABNORMAL HIGH (ref 70–99)

## 2014-03-14 MED ORDER — FLUDEOXYGLUCOSE F - 18 (FDG) INJECTION
6.5000 | Freq: Once | INTRAVENOUS | Status: AC | PRN
  Administered 2014-03-14: 6.5 via INTRAVENOUS

## 2014-03-14 NOTE — Telephone Encounter (Signed)
LMTCBx2 for RN supervisor. [place], CMA

## 2014-03-15 NOTE — Telephone Encounter (Signed)
RN supervisor aware and pet was done on 03-14-14. [place] Bing, CMA

## 2014-03-15 NOTE — Telephone Encounter (Signed)
Pet scan results will be discussed on fu appt Nodule did light up

## 2014-03-29 ENCOUNTER — Ambulatory Visit (INDEPENDENT_AMBULATORY_CARE_PROVIDER_SITE_OTHER): Payer: Medicare Other | Admitting: Pulmonary Disease

## 2014-03-29 ENCOUNTER — Encounter: Payer: Self-pay | Admitting: Pulmonary Disease

## 2014-03-29 VITALS — BP 150/82 | HR 94 | Ht 62.0 in | Wt 93.4 lb

## 2014-03-29 DIAGNOSIS — R222 Localized swelling, mass and lump, trunk: Secondary | ICD-10-CM

## 2014-03-29 DIAGNOSIS — R918 Other nonspecific abnormal finding of lung field: Secondary | ICD-10-CM

## 2014-03-29 NOTE — Patient Instructions (Signed)
Left upper lung nodule is likely cancer Lymph nodes did not light up Lung capacity is at 65% We discussed biopsy procedures & will decide on the best one in 2 -3 weeks

## 2014-03-29 NOTE — Assessment & Plan Note (Signed)
Left upper lung nodule is likely cancer Mediastinal Lymph nodes did not light up except small pre-carinal We discussed biopsy procedures - ideally needs EBUS staging & ENB vs CT guided TTNA of LUL lesion Will review with radiologist & decide on best procedure, concern for general anesthesia given frail nature Probably , not a candidate for resection  The various options of biopsy including bronchoscopy, CT guided needle aspiration and surgical biopsy were discussed.The risks of each procedure including coughing, bleeding and the  chances of lung puncture requiring chest tube were discussed in great detail. The benefits & alternatives including serial follow up were also discussed.

## 2014-03-29 NOTE — Progress Notes (Signed)
   Subjective:    Patient ID: Michele Johns, female    DOB: [birth year], 80 y.o.   MRN: [id number]  HPI  80 year old remote smoker admitted on 02/28/14 with syncopal episode and acute right hip pain due to a right femoral non- traumatic fracture. She underwent THR (yates), developed dyspnea, hypoxia and sinus tachycardia postop, CT angiogram chest on 5/13 showed irregular mass in anterior medial left upper lobe measuring 2 x 3.5 cm with 0.3 cm focus of calcification within this mass. There was subcarinal lymphadenopathy measuring 3.2 x 1.8 cm. There is a 1.2 cm lymph node in the right hilum. She also had bilateral moderate pleural effusions, underwent right-sided thoracenteses with removal of 530 cc of clear transudate of fluid, by protein and LDH.  Echo showed nml LV fn She's improving with rehabilitation - now ambulating with Jocelyn..  Currently denies dyspnea or chest pain.  She does not have a PCP and has not seen a doctor in years  She smoked for about 5 years in her 25s.  She has always lived in [place]   PET showed intense hypermetabolism in LUL nodule Surprisingly, No hypermetabolic mediastinal lymph nodes. Spirometry >>ratio 78, FEV1 1.16- 67%, FVC 1.50 -63%  Past Medical History  Diagnosis Date  . Lung mass   . COPD (chronic obstructive pulmonary disease)   . Osteoporosis   . Hypoxemia   . Anemia     Review of Systems neg for any significant sore throat, dysphagia, itching, sneezing, nasal congestion or excess/ purulent secretions, fever, chills, sweats, unintended wt loss, pleuritic or exertional cp, hempoptysis, orthopnea pnd or change in chronic leg swelling. Also denies presyncope, palpitations, heartburn, abdominal pain, nausea, vomiting, diarrhea or change in bowel or urinary habits, dysuria,hematuria, rash, arthralgias, visual complaints, headache, numbness weakness or ataxia.     Objective:   Physical Exam  Gen. Pleasant, frail, in no distress, normal affect, in  wheelchair ENT - no lesions, no post nasal drip Neck: No JVD, no thyromegaly, no carotid bruits Lungs: no use of accessory muscles, no dullness to percussion, left basal rales, no rhonchi  Cardiovascular: Rhythm regular, heart sounds  normal, no murmurs or gallops, no peripheral edema Abdomen: soft and non-tender, no hepatosplenomegaly, BS normal. Musculoskeletal: No deformities, no cyanosis or clubbing Neuro:  alert, non focal        Assessment & Plan:

## 2014-04-03 ENCOUNTER — Telehealth: Payer: Self-pay | Admitting: Pulmonary Disease

## 2014-04-03 NOTE — Telephone Encounter (Signed)
Can you let her know that I scheduled biopsy with bronchoscopy under anesthesia 04/10/14@7 :30am@[facility]  ? Send back to me to place orders pl

## 2014-04-03 NOTE — Telephone Encounter (Signed)
Called spoke with daughter. Aware of apt date/time.location. Nothing PO after midnight.  Will forward back to Dr. Elsworth Soho

## 2014-04-04 ENCOUNTER — Encounter (HOSPITAL_COMMUNITY): Payer: Self-pay | Admitting: Pharmacy Technician

## 2014-04-05 ENCOUNTER — Encounter (HOSPITAL_COMMUNITY): Payer: Self-pay | Admitting: *Deleted

## 2014-04-05 ENCOUNTER — Other Ambulatory Visit: Payer: Self-pay | Admitting: Pulmonary Disease

## 2014-04-05 NOTE — Telephone Encounter (Signed)
Orders placed.

## 2014-04-10 ENCOUNTER — Ambulatory Visit (HOSPITAL_COMMUNITY)
Admission: RE | Admit: 2014-04-10 | Discharge: 2014-04-10 | Disposition: A | Discharge status: Home / Self Care | Payer: Medicare Other | Source: Ambulatory Visit | Attending: Pulmonary Disease | Admitting: Pulmonary Disease

## 2014-04-10 ENCOUNTER — Ambulatory Visit (HOSPITAL_COMMUNITY): Payer: Medicare Other

## 2014-04-10 ENCOUNTER — Encounter (HOSPITAL_COMMUNITY)
Admission: RE | Disposition: A | Discharge status: Home / Self Care | Payer: Self-pay | Source: Ambulatory Visit | Attending: Pulmonary Disease

## 2014-04-10 ENCOUNTER — Encounter (HOSPITAL_COMMUNITY): Payer: Medicare Other | Admitting: Registered Nurse

## 2014-04-10 ENCOUNTER — Encounter (HOSPITAL_COMMUNITY): Payer: Self-pay | Admitting: *Deleted

## 2014-04-10 ENCOUNTER — Ambulatory Visit (HOSPITAL_COMMUNITY): Payer: Medicare Other | Admitting: Registered Nurse

## 2014-04-10 DIAGNOSIS — J4489 Other specified chronic obstructive pulmonary disease: Secondary | ICD-10-CM | POA: Insufficient documentation

## 2014-04-10 DIAGNOSIS — R918 Other nonspecific abnormal finding of lung field: Secondary | ICD-10-CM

## 2014-04-10 DIAGNOSIS — J449 Chronic obstructive pulmonary disease, unspecified: Secondary | ICD-10-CM | POA: Insufficient documentation

## 2014-04-10 DIAGNOSIS — R222 Localized swelling, mass and lump, trunk: Secondary | ICD-10-CM | POA: Insufficient documentation

## 2014-04-10 DIAGNOSIS — R599 Enlarged lymph nodes, unspecified: Secondary | ICD-10-CM | POA: Insufficient documentation

## 2014-04-10 DIAGNOSIS — Z87891 Personal history of nicotine dependence: Secondary | ICD-10-CM | POA: Insufficient documentation

## 2014-04-10 HISTORY — DX: Personal history of other medical treatment: Z92.89

## 2014-04-10 HISTORY — PX: ENDOBRONCHIAL ULTRASOUND: SHX5096

## 2014-04-10 SURGERY — ENDOBRONCHIAL ULTRASOUND (EBUS)
Anesthesia: General | Laterality: Bilateral

## 2014-04-10 MED ORDER — REMIFENTANIL HCL 1 MG IV SOLR
INTRAVENOUS | Status: AC
  Filled 2014-04-10: qty 1000

## 2014-04-10 MED ORDER — PHENYLEPHRINE HCL 10 MG/ML IJ SOLN
INTRAMUSCULAR | Status: AC
  Filled 2014-04-10: qty 1

## 2014-04-10 MED ORDER — REMIFENTANIL HCL 2 MG IV SOLR
2000.0000 ug | INTRAVENOUS | Status: DC | PRN
  Administered 2014-04-10: .6 ug/kg/min via INTRAVENOUS

## 2014-04-10 MED ORDER — PROPOFOL 10 MG/ML IV BOLUS
INTRAVENOUS | Status: DC | PRN
  Administered 2014-04-10: 70 mg via INTRAVENOUS

## 2014-04-10 MED ORDER — PROPOFOL 10 MG/ML IV BOLUS
INTRAVENOUS | Status: AC
  Filled 2014-04-10: qty 20

## 2014-04-10 MED ORDER — LACTATED RINGERS IV SOLN
INTRAVENOUS | Status: DC
  Administered 2014-04-10 (×2): via INTRAVENOUS

## 2014-04-10 MED ORDER — PHENYLEPHRINE HCL 10 MG/ML IJ SOLN
INTRAMUSCULAR | Status: DC | PRN
  Administered 2014-04-10: 120 ug via INTRAVENOUS
  Administered 2014-04-10 (×2): 160 ug via INTRAVENOUS
  Administered 2014-04-10: 80 ug via INTRAVENOUS

## 2014-04-10 MED ORDER — FENTANYL CITRATE 0.05 MG/ML IJ SOLN
INTRAMUSCULAR | Status: AC
  Filled 2014-04-10: qty 2

## 2014-04-10 MED ORDER — MIDAZOLAM HCL 2 MG/2ML IJ SOLN
INTRAMUSCULAR | Status: AC
  Filled 2014-04-10: qty 2

## 2014-04-10 MED ORDER — LIDOCAINE HCL (CARDIAC) 20 MG/ML IV SOLN
INTRAVENOUS | Status: DC | PRN
  Administered 2014-04-10: 25 mg via INTRAVENOUS
  Administered 2014-04-10: 25 mg via INTRATRACHEAL

## 2014-04-10 MED ORDER — PHENYLEPHRINE 40 MCG/ML (10ML) SYRINGE FOR IV PUSH (FOR BLOOD PRESSURE SUPPORT)
PREFILLED_SYRINGE | INTRAVENOUS | Status: AC
  Filled 2014-04-10: qty 20

## 2014-04-10 MED ORDER — SUCCINYLCHOLINE CHLORIDE 20 MG/ML IJ SOLN
INTRAMUSCULAR | Status: DC | PRN
  Administered 2014-04-10: 60 mg via INTRAVENOUS

## 2014-04-10 MED ORDER — LIDOCAINE HCL (CARDIAC) 20 MG/ML IV SOLN
INTRAVENOUS | Status: AC
  Filled 2014-04-10: qty 5

## 2014-04-10 MED ORDER — PHENYLEPHRINE HCL 10 MG/ML IJ SOLN
10.0000 mg | INTRAMUSCULAR | Status: DC | PRN
  Administered 2014-04-10: 10 ug/min via INTRAVENOUS

## 2014-04-10 MED ORDER — ONDANSETRON HCL 4 MG/2ML IJ SOLN
INTRAMUSCULAR | Status: AC
  Filled 2014-04-10: qty 2

## 2014-04-10 NOTE — Anesthesia Postprocedure Evaluation (Signed)
  Anesthesia Post-op Note  Patient: Michele Johns  Procedure(s) Performed: Procedure(s) (LRB): ENDOBRONCHIAL ULTRASOUND (Bilateral)  Patient Location: PACU  Anesthesia Type: General  Level of Consciousness: awake and alert   Airway and Oxygen Therapy: Patient Spontanous Breathing  Post-op Pain: mild  Post-op Assessment: Post-op Vital signs reviewed, Patient's Cardiovascular Status Stable, Respiratory Function Stable, Patent Airway and No signs of Nausea or vomiting  Last Vitals:  Filed Vitals:   04/10/14 1021  BP: 129/62  Pulse: 93  Temp:   Resp: 21    Post-op Vital Signs: stable   Complications: No apparent anesthesia complications

## 2014-04-10 NOTE — Interval H&P Note (Signed)
Para Cossey has presented today for procedure, with the diagnosis of LUL mass with mediastinal lymphadenopathy. The various methods of treatment have been discussed with the patient and family. After consideration of risks, benefits and other options for treatment, the patient has consented to Procedure(s) :  Bronchoscopy with transbronchial aspiration of lymph nodes under EBUS .  The patient's history has been reviewed, patient examined, no change in status, stable for surgery. I have reviewed the patient's chart and labs. Questions were answered to the patient's satisfaction

## 2014-04-10 NOTE — Transfer of Care (Signed)
Immediate Anesthesia Transfer of Care Note  Patient: Michele Johns  Procedure(s) Performed: Procedure(s): ENDOBRONCHIAL ULTRASOUND (Bilateral)  Patient Location: PACU  Anesthesia Type:General  Level of Consciousness: awake, alert , oriented and patient cooperative  Airway & Oxygen Therapy: Patient Spontanous Breathing and Patient connected to face mask oxygen  Post-op Assessment: Report given to PACU RN, Post -op Vital signs reviewed and stable and Patient moving all extremities X 4  Post vital signs: stable  Complications: No apparent anesthesia complications

## 2014-04-10 NOTE — Anesthesia Preprocedure Evaluation (Addendum)
Anesthesia Evaluation  Patient identified by MRN, date of birth, ID band Patient awake    Reviewed: Allergy & Precautions, H&P , NPO status , Patient's Chart, lab work & pertinent test results  Airway Mallampati: II TM Distance: >3 FB Neck ROM: Full    Dental no notable dental hx. (+) Edentulous Upper, Edentulous Lower   Pulmonary neg pulmonary ROS, COPDformer smoker,  breath sounds clear to auscultation  Pulmonary exam normal       Cardiovascular negative cardio ROS  Rhythm:Regular Rate:Normal     Neuro/Psych negative neurological ROS  negative psych ROS   GI/Hepatic negative GI ROS, Neg liver ROS,   Endo/Other  negative endocrine ROS  Renal/GU negative Renal ROS  negative genitourinary   Musculoskeletal negative musculoskeletal ROS (+)   Abdominal   Peds negative pediatric ROS (+)  Hematology negative hematology ROS (+)   Anesthesia Other Findings   Reproductive/Obstetrics negative OB ROS                          Anesthesia Physical Anesthesia Plan  ASA: II  Anesthesia Plan: General   Post-op Pain Management:    Induction: Intravenous  Airway Management Planned: Oral ETT  Additional Equipment:   Intra-op Plan:   Post-operative Plan: Extubation in OR  Informed Consent: I have reviewed the patients History and Physical, chart, labs and discussed the procedure including the risks, benefits and alternatives for the proposed anesthesia with the patient or authorized representative who has indicated his/her understanding and acceptance.   Dental advisory given  Plan Discussed with: CRNA  Anesthesia Plan Comments:         Anesthesia Quick Evaluation

## 2014-04-10 NOTE — Progress Notes (Signed)
Video Bronchoscopy done prior to EBUS procedure  Intervention Bronchial Washing done Intervention Bronchial brushing done  Procedure tolerated well

## 2014-04-10 NOTE — Op Note (Signed)
  Name:  [name] MRN:  [id number] DOB:  1930/02/29  PROCEDURE NOTE  Procedure(s): Flexible bronchoscopy [phone]) Brushing [phone]) of the LUL Bronchial alveolar lavage (11216) of the LUL Endobronchial ultrasound (24469) Transbronchial needle aspiration (50722) of the station 7   Indications: Sub carinal lymphadenopathy & LUL mass  Consent:  Written informed consent was obtained prior to the procedure. The risks of the procedure including coughing, bleeding and the small chance of lung puncture requiring chest tube were discussed in great detail. The benefits & alternatives including serial follow up were also discussed.  Anesthesia:  General endotracheal.  Procedure summary:  Appropriate equipment was assembled.  The patient was  identified as Michele Johns. Interim history obtained and brought to the operating room. Safety timeout was performed. The patient was placed supine on the operating table, airway established and general anesthesia administered by Anesthesia team.   After the appropriate level of anesthesia was assured, flexible video bronchoscope was lubricated and inserted through the endotracheal tube.    Airway examination was performed bilaterally to subsegmental level.  Minimal clear secretions were noted, mucosa appeared normal and no endobronchial lesions were identified. BAL & brushings obtained from the LUL  Endobronchial ultrasound video bronchoscope was then lubricated and inserted through the endotracheal tube. Surveillance of the mediastinal and and bilateral hilar lymph node stations was performed.  Pathologically enlarged lymph nodes were noted at station 7.  Endobronchial ultrasound guided transbronchial needle aspiration of station 7 (5 passes) were performed, after which EBUS bronchoscope was withdrawn.  The patient was extubated in operating room and transferred to PACU. Post-procedure chest x-ray was ordered.  Specimens sent: Bronchial alveolar lavage &  brushings specimen of the LUL for  microbiology and cytology. TBNA of station 7  Complications:  No immediate complications were noted.  Hemodynamic parameters and oxygenation remained stable throughout the procedure.  Estimated blood loss:  Less then 5 mL.   Kara Mead MD. Shade Flood. [facility] Pager [phone] If no response call [phone]   04/10/2014 8:53 AM

## 2014-04-10 NOTE — Discharge Instructions (Signed)
Keep follow up appointment  Flexible Bronchoscopy, Care After These instructions give you information on caring for yourself after your procedure. Your doctor may also give you more specific instructions. Call your doctor if you have any problems or questions after your procedure. HOME CARE  Do not eat or drink anything for 2 hours after your procedure. If you try to eat or drink before the medicine wears off, food or drink could go into your lungs. You could also burn yourself.  After 2 hours have passed and when you can cough and gag normally, you may eat soft food and drink liquids slowly.  The day after the test, you may eat your normal diet.  You may do your normal activities.  Keep all doctor visits. GET HELP RIGHT AWAY IF:  You get more and more short of breath.  You get light-headed.  You feel like you are going to pass out (faint).  You have chest pain.  You have new problems that worry you.  You cough up more than a little blood.  You cough up more blood than before. MAKE SURE YOU:  Understand these instructions.  Will watch your condition.  Will get help right away if you are not doing well or get worse. Document Released: 08/03/2009 Document Revised: 10/11/2013 Document Reviewed: 06/10/2013 [facility] Patient Information 2015 [place], [place]. This information is not intended to replace advice given to you by your health care provider. Make sure you discuss any questions you have with your health care provider.

## 2014-04-10 NOTE — Anesthesia Procedure Notes (Signed)
Procedure Name: Intubation Date/Time: 04/10/2014 8:05 AM Performed by: Lissa Morales Pre-anesthesia Checklist: Patient identified, Emergency Drugs available, Suction available and Patient being monitored Patient Re-evaluated:Patient Re-evaluated prior to inductionOxygen Delivery Method: Circle system utilized Intubation Type: IV induction Tube size: 8.5 mm Number of attempts: 1 Airway Equipment and Method: Bougie stylet (8.0 ETT too small for  endobroncial scope. Dr. Eartha Inch requests new ETT 8.5 . bougie used as tube changer and ETT changed over bougie.easily. Dr Marcell Barlow called) Placement Confirmation: positive ETCO2 and breath sounds checked- equal and bilateral Secured at: 20 cm Tube secured with: Tape Dental Injury: Teeth and Oropharynx as per pre-operative assessment  Comments: ETT changed with bougie to accomidate  Endobronchial scope without difficulty

## 2014-04-10 NOTE — H&P (View-Only) (Signed)
   Subjective:    Patient ID: Michele Johns, female    DOB: 07/03/1930, 80 y.o.   MRN: [id number]  HPI  80 year old remote smoker admitted on 02/28/14 with syncopal episode and acute right hip pain due to a right femoral non- traumatic fracture. She underwent THR (yates), developed dyspnea, hypoxia and sinus tachycardia postop, CT angiogram chest on 5/13 showed irregular mass in anterior medial left upper lobe measuring 2 x 3.5 cm with 0.3 cm focus of calcification within this mass. There was subcarinal lymphadenopathy measuring 3.2 x 1.8 cm. There is a 1.2 cm lymph node in the right hilum. She also had bilateral moderate pleural effusions, underwent right-sided thoracenteses with removal of 530 cc of clear transudate of fluid, by protein and LDH.  Echo showed nml LV fn She's improving with rehabilitation - now ambulating with [place]..  Currently denies dyspnea or chest pain.  She does not have a PCP and has not seen a doctor in years  She smoked for about 5 years in her 67s.  She has always lived in [place]   PET showed intense hypermetabolism in LUL nodule Surprisingly, No hypermetabolic mediastinal lymph nodes. Spirometry >>ratio 78, FEV1 1.16- 67%, FVC 1.50 -63%  Past Medical History  Diagnosis Date  . Lung mass   . COPD (chronic obstructive pulmonary disease)   . Osteoporosis   . Hypoxemia   . Anemia     Review of Systems neg for any significant sore throat, dysphagia, itching, sneezing, nasal congestion or excess/ purulent secretions, fever, chills, sweats, unintended wt loss, pleuritic or exertional cp, hempoptysis, orthopnea pnd or change in chronic leg swelling. Also denies presyncope, palpitations, heartburn, abdominal pain, nausea, vomiting, diarrhea or change in bowel or urinary habits, dysuria,hematuria, rash, arthralgias, visual complaints, headache, numbness weakness or ataxia.     Objective:   Physical Exam  Gen. Pleasant, frail, in no distress, normal affect, in  wheelchair ENT - no lesions, no post nasal drip Neck: No JVD, no thyromegaly, no carotid bruits Lungs: no use of accessory muscles, no dullness to percussion, left basal rales, no rhonchi  Cardiovascular: Rhythm regular, heart sounds  normal, no murmurs or gallops, no peripheral edema Abdomen: soft and non-tender, no hepatosplenomegaly, BS normal. Musculoskeletal: No deformities, no cyanosis or clubbing Neuro:  alert, non focal        Assessment & Plan:

## 2014-04-11 ENCOUNTER — Encounter (HOSPITAL_COMMUNITY): Payer: Self-pay | Admitting: Pulmonary Disease

## 2014-04-13 ENCOUNTER — Other Ambulatory Visit: Payer: Self-pay | Admitting: Pulmonary Disease

## 2014-04-13 DIAGNOSIS — R918 Other nonspecific abnormal finding of lung field: Secondary | ICD-10-CM

## 2014-04-14 ENCOUNTER — Other Ambulatory Visit: Payer: Self-pay | Admitting: Radiology

## 2014-04-14 NOTE — OR Nursing (Signed)
Addendum to scope page 

## 2014-04-17 ENCOUNTER — Other Ambulatory Visit: Payer: Self-pay | Admitting: Radiology

## 2014-04-18 ENCOUNTER — Ambulatory Visit (HOSPITAL_COMMUNITY)
Admission: RE | Admit: 2014-04-18 | Discharge: 2014-04-18 | Disposition: A | Discharge status: Home / Self Care | Payer: Medicare Other | Source: Ambulatory Visit | Attending: Pulmonary Disease | Admitting: Pulmonary Disease

## 2014-04-18 ENCOUNTER — Encounter (HOSPITAL_COMMUNITY): Payer: Self-pay

## 2014-04-18 VITALS — BP 124/39 | HR 80 | Temp 98.1°F | Resp 16 | Ht 62.0 in | Wt 91.0 lb

## 2014-04-18 DIAGNOSIS — C3410 Malignant neoplasm of upper lobe, unspecified bronchus or lung: Secondary | ICD-10-CM | POA: Insufficient documentation

## 2014-04-18 DIAGNOSIS — R918 Other nonspecific abnormal finding of lung field: Secondary | ICD-10-CM

## 2014-04-18 DIAGNOSIS — Z9889 Other specified postprocedural states: Secondary | ICD-10-CM

## 2014-04-18 DIAGNOSIS — R55 Syncope and collapse: Secondary | ICD-10-CM | POA: Insufficient documentation

## 2014-04-18 HISTORY — PX: LUNG BIOPSY: SHX232

## 2014-04-18 LAB — PROTIME-INR
INR: 1.05 (ref 0.00–1.49)
Prothrombin Time: 13.7 seconds (ref 11.6–15.2)

## 2014-04-18 LAB — APTT: APTT: 39 s — AB (ref 24–37)

## 2014-04-18 LAB — AFB CULTURE WITH SMEAR (NOT AT ARMC): Acid Fast Smear: NONE SEEN

## 2014-04-18 LAB — CBC
HCT: 45.5 % (ref 36.0–46.0)
HEMOGLOBIN: 14.7 g/dL (ref 12.0–15.0)
MCH: 30.1 pg (ref 26.0–34.0)
MCHC: 32.3 g/dL (ref 30.0–36.0)
MCV: 93.2 fL (ref 78.0–100.0)
PLATELETS: 289 10*3/uL (ref 150–400)
RBC: 4.88 MIL/uL (ref 3.87–5.11)
RDW: 13.3 % (ref 11.5–15.5)
WBC: 8.8 10*3/uL (ref 4.0–10.5)

## 2014-04-18 MED ORDER — FENTANYL CITRATE 0.05 MG/ML IJ SOLN
INTRAMUSCULAR | Status: AC | PRN
  Administered 2014-04-18 (×3): 25 ug via INTRAVENOUS

## 2014-04-18 MED ORDER — LIDOCAINE HCL 1 % IJ SOLN
INTRAMUSCULAR | Status: AC
  Filled 2014-04-18: qty 10

## 2014-04-18 MED ORDER — FENTANYL CITRATE 0.05 MG/ML IJ SOLN
INTRAMUSCULAR | Status: DC
Start: 2014-04-18 — End: 2014-04-19
  Filled 2014-04-18: qty 4

## 2014-04-18 MED ORDER — MIDAZOLAM HCL 2 MG/2ML IJ SOLN
INTRAMUSCULAR | Status: AC | PRN
  Administered 2014-04-18 (×4): 1 mg via INTRAVENOUS

## 2014-04-18 MED ORDER — SODIUM CHLORIDE 0.9 % IV SOLN: INTRAVENOUS | Status: DC

## 2014-04-18 MED ORDER — MIDAZOLAM HCL 2 MG/2ML IJ SOLN
INTRAMUSCULAR | Status: AC
  Filled 2014-04-18: qty 4

## 2014-04-18 MED ORDER — HYDROCODONE-ACETAMINOPHEN 5-325 MG PO TABS
1.0000 | ORAL_TABLET | ORAL | Status: DC | PRN
  Filled 2014-04-18: qty 2

## 2014-04-18 NOTE — Sedation Documentation (Signed)
Keep pt lying on L side for at least 1 hour per MD

## 2014-04-18 NOTE — Procedures (Signed)
CT guided core biopsies of left upper lobe lesion.  Three cores obtained.  No immediate complication.

## 2014-04-18 NOTE — Discharge Instructions (Signed)
Lung Biopsy A lung biopsy is a procedure in which a tissue sample is removed from the lung. The tissue can be examined under a microscope to help diagnose various lung disorders.  LET [facility] CARE PROVIDER KNOW ABOUT:  Any allergies you have.  All medicines you are taking, including vitamins, herbs, eye drops, creams, and over-the-counter medicines.  Previous problems you or members of your family have had with the use of anesthetics.  Any blood disorders or bleeding problems that you have.  Previous surgeries you have had.  Medical conditions you have. RISKS AND COMPLICATIONS Generally, a lung biopsy is a safe procedure. However, as with any procedure, complications can occur. Possible complications include:  Collapse of the lung.   Bleeding.   Infection.  BEFORE THE PROCEDURE  Do not eat or drink anything for 8 hours before the procedure or as directed by your health care provider.  Ask your health care provider if you need to change or stop taking your regular medicines before the procedure.  Arrange for someone to drive you home after the procedure. PROCEDURE Various methods can be used to perform a lung biopsy:   Needle biopsy: A biopsy needle is inserted into the lung. The needle is used to collect the tissue sample. A CT scanner may be used to guide the needle to the right place in the lung. For this method, a medicine is used to numb the area where the biopsy sample will be taken (local anesthetic).  Bronchoscopy: A flexible tube (bronchoscope) is inserted into your lungs by going through your mouth or nose. A needle or forceps is passed through the bronchoscope to remove the tissue sample. For this method, medicine may be used to numb the back of your throat.  Open biopsy: A cut (incision) is made in your chest. The tissue sample is then removed using surgical tools. The incision is closed with skin glue, skin adhesive strips, or stitches. For this method, you  will be given medicine to make you sleep through the procedure (general anesthetic). AFTER THE PROCEDURE Your recovery will be assessed and monitored. For the needle or bronchoscope method, you may be allowed to go home on the day of your procedure as soon as you are stable. For the open biopsy method, you may need to stay in the hospital overnight for observation. You might have soreness and tenderness at the site of the biopsy for a few days after the procedure. You might have a cough and some soreness in your throat for a few days if a bronchoscope was used. Document Released: 12/25/2004 Document Revised: 06/08/2013 Document Reviewed: 03/20/2013 [facility] Patient Information 2015 [place], [place]. This information is not intended to replace advice given to you by your health care provider. Make sure you discuss any questions you have with your health care provider.    Needle Biopsy of Lung, Care After Refer to this sheet in the next few weeks. These instructions provide you with information on caring for yourself after your procedure. Your health care provider may also give you more specific instructions. Your treatment has been planned according to current medical practices, but problems sometimes occur. Call your health care provider if you have any problems or questions after your procedure. WHAT TO EXPECT AFTER THE PROCEDURE A bandage will be applied over the area where the needle was inserted. You may be asked to apply pressure to the bandage for several minutes to ensure there is minimal bleeding. In most cases, you can leave  when your needle biopsy procedure is completed. Do not drive yourself home. Someone else should take you home. If you received an IV sedative or general anesthetic, you will be taken to a comfortable place to relax while the medication wears off. If you have upcoming travel scheduled, talk to your doctor about when it is safe to travel by air after the procedure. HOME CARE  INSTRUCTIONS Expect to take it easy for the rest of the day. Protect the area where you received the needle biopsy by keeping the bandage in place for as long as instructed. You may feel some mild pain or discomfort in the area, but this should stop in a day or two. Only take over-the-counter or prescription medicines for pain, discomfort, or fever as directed by your health care provider. SEEK MEDICAL CARE IF:   You have pain at the biopsy site that worsens or is not helped by medication.  You have swelling or drainage at the needle biopsy site.  You have a fever. SEEK IMMEDIATE MEDICAL CARE IF:   You have new or worsening shortness of breath.  You have chest pain.  You are coughing up blood.  You have bleeding that does not stop with pressure or a bandage.  You develop light-headedness or fainting. MAKE SURE YOU:  Understand these instructions.  Will watch your condition.  Will get help right away if you are not doing well or get worse. Document Released: 08/03/2007 Document Revised: 10/11/2013 Document Reviewed: 02/28/2013 [facility] Patient Information 2015 [place], [place]. This information is not intended to replace advice given to you by your health care provider. Make sure you discuss any questions you have with your health care provider.

## 2014-04-18 NOTE — H&P (Signed)
Chief Complaint: "I am here for a lung biopsy." Referring Physician: Dr. Elsworth Soho HPI: Michele Johns is an 80 y.o. female who presents today for an image guided LUL lung mass biopsy. She recently underwent a bronchoscopy 04/10/14 with no malignant cells identified. She had right hip surgery 02/2014 and CTA imaging during that time was found to have a LUL lung mass. She is a previous tobacco user, quit > 20 years ago. She admits to weight loss secondary to appetite loss after her right hip surgery. She denies any chest pain, change in chronic shortness of breath or palpitations. She denies any active signs of bleeding or excessive bruising. she denies any recent fever or chills. The patient denies any history of sleep apnea or chronic oxygen use. She has previously tolerated sedation without complications.    Past Medical History:  Past Medical History  Diagnosis Date  . Lung mass   . COPD (chronic obstructive pulmonary disease)   . Osteoporosis   . Hypoxemia   . Anemia   . History of blood transfusion may 2015    Past Surgical History:  Past Surgical History  Procedure Laterality Date  . Total hip arthroplasty Right 03/01/2014    Procedure: TOTAL HIP ARTHROPLASTY ANTERIOR APPROACH;  Surgeon: Marybelle Killings, MD;  Location: [facility];  Service: Orthopedics;  Laterality: Right;  . Cholecystectomy  yrs ago  . Endobronchial ultrasound Bilateral 04/10/2014    Procedure: ENDOBRONCHIAL ULTRASOUND;  Surgeon: Rigoberto Noel, MD;  Location: [facility];  Service: Cardiopulmonary;  Laterality: Bilateral;    Family History: No family history on file.  Social History:  reports that she quit smoking about 20 years ago. Her smoking use included Cigarettes. She has a 7.5 pack-year smoking history. She has never used smokeless tobacco. She reports that she does not drink alcohol or use illicit drugs.  Allergies:  Allergies  Allergen Reactions  . Penicillins Other (See Comments)    Reaction: blisters on leg     Medications:   Medication List    ASK your doctor about these medications       aspirin EC 325 MG tablet  Take 1 tablet (325 mg total) by mouth daily.     calcium carbonate 1250 MG tablet  Commonly known as:  OS-CAL - dosed in mg of elemental calcium  Take 1 tablet (1,250 mg total) by mouth daily with breakfast.     HYDROcodone-acetaminophen 5-325 MG per tablet  Commonly known as:  NORCO  Take 1-2 tablets by mouth every 4 (four) hours as needed for moderate pain.     multivitamin with minerals Tabs tablet  Take 1 tablet by mouth daily.     naproxen sodium 220 MG tablet  Commonly known as:  ANAPROX  Take 440 mg by mouth daily.     OVER THE COUNTER MEDICATION  Take 60 mLs by mouth 3 (three) times daily. Med Pass 2.0     vitamin D (CHOLECALCIFEROL) 400 UNITS tablet  Take 2 tablets (800 Units total) by mouth daily.       Please HPI for pertinent positives, otherwise complete 10 system ROS negative.  Physical Exam: BP 140/55  Pulse 88  Temp(Src) 97.8 F (36.6 C) (Oral)  Resp 18  Ht 5\' 2"  (1.575 m)  Wt 91 lb (41.277 kg)  BMI 16.64 kg/m2  SpO2 98% Body mass index is 16.64 kg/(m^2).  General Appearance:  Alert, cooperative, no distress  Head:  Normocephalic, without obvious abnormality, atraumatic  Neck: Supple, symmetrical, trachea midline  Lungs:   Clear to auscultation bilaterally, no w/r/r, respirations unlabored without use of accessory muscles.  Chest Wall:  No tenderness or deformity  Heart:  Regular rate and rhythm, S1, S2 normal, no murmur, rub or gallop.  Abdomen:   Soft, non-tender, non distended, (+) BS  Extremities: Extremities normal, atraumatic, no cyanosis or edema  Neurologic: Normal affect, no gross deficits.   Results for orders placed during the hospital encounter of 04/18/14 (from the past 48 hour(s))  APTT     Status: Abnormal   Collection Time    04/18/14  7:39 AM      Result Value Ref Range   aPTT 39 (*) 24 - 37 seconds   Comment:             IF BASELINE aPTT IS ELEVATED,     SUGGEST PATIENT RISK ASSESSMENT     BE USED TO DETERMINE APPROPRIATE     ANTICOAGULANT THERAPY.  CBC     Status: None   Collection Time    04/18/14  7:39 AM      Result Value Ref Range   WBC 8.8  4.0 - 10.5 K/uL   RBC 4.88  3.87 - 5.11 MIL/uL   Hemoglobin 14.7  12.0 - 15.0 g/dL   HCT 45.5  36.0 - 46.0 %   MCV 93.2  78.0 - 100.0 fL   MCH 30.1  26.0 - 34.0 pg   MCHC 32.3  30.0 - 36.0 g/dL   RDW 13.3  11.5 - 15.5 %   Platelets 289  150 - 400 K/uL  PROTIME-INR     Status: None   Collection Time    04/18/14  7:39 AM      Result Value Ref Range   Prothrombin Time 13.7  11.6 - 15.2 seconds   INR 1.05  0.00 - 1.49   No results found.  Assessment/Plan Left upper lobe lung mass, hypermetabolic on PET Weight loss. Appetite change. Previous tobacco abuse, quit > 20 years ago. S/p bronchoscopy 04/10/14 with no malignant cells identified. Scheduled today for CT guided LUL lung mass biopsy with moderate sedation. Patient has been NPO, no blood thinners taken, labs and images reviewed. Risks and Benefits discussed with the patient including, but not limited to bleeding, infection, pneumothorax requiring chest tube placement and admission, or even death. All of the patient's questions were answered, patient is agreeable to proceed. Consent signed and in chart.   Tsosie Billing D PA-C 04/18/2014, 8:36 AM

## 2014-04-20 ENCOUNTER — Encounter (INDEPENDENT_AMBULATORY_CARE_PROVIDER_SITE_OTHER): Payer: Self-pay

## 2014-04-20 ENCOUNTER — Encounter: Payer: Self-pay | Admitting: Pulmonary Disease

## 2014-04-20 ENCOUNTER — Ambulatory Visit (INDEPENDENT_AMBULATORY_CARE_PROVIDER_SITE_OTHER): Payer: Medicare Other | Admitting: Pulmonary Disease

## 2014-04-20 VITALS — BP 128/68 | HR 101 | Temp 97.4°F | Ht 62.0 in | Wt 90.6 lb

## 2014-04-20 DIAGNOSIS — C3490 Malignant neoplasm of unspecified part of unspecified bronchus or lung: Secondary | ICD-10-CM

## 2014-04-20 DIAGNOSIS — C3492 Malignant neoplasm of unspecified part of left bronchus or lung: Secondary | ICD-10-CM

## 2014-04-20 NOTE — Progress Notes (Signed)
   Subjective:    Patient ID: Michele Johns, female    DOB: 12/25/1929, 80 y.o.   MRN: [id number]  HPI  80 year old remote smoker for followup of COPD and lung mass. admitted on 02/28/14 with syncopal episode and acute right hip pain due to a right femoral non- traumatic fracture. She underwent THR (yates), developed dyspnea, hypoxia and sinus tachycardia postop, CT angiogram chest on 5/13 showed irregular mass in anterior medial left upper lobe measuring 2 x 3.5 cm with 0.3 cm focus of calcification within this mass. There was subcarinal lymphadenopathy measuring 3.2 x 1.8 cm. There was a 1.2 cm lymph node in the right hilum. She also had bilateral moderate pleural effusions, underwent right-sided thoracenteses with removal of 530 cc of clear transudate of fluid, by protein and LDH.  Echo showed nml LV fn   She smoked for about 5 years in her 16s.  She has always lived in [place]  PET showed intense hypermetabolism in LUL nodule  Surprisingly, No hypermetabolic mediastinal lymph nodes.  Spirometry >>ratio 78, FEV1 1.16- 67%, FVC 1.50 -63%  Chief Complaint  Patient presents with  . Follow-up    Pt here to review biopsy results. Pt states her breathing is doing well. Denies cough, SOB and CP/tightness.    She's improving with rehabilitation - now ambulating with [place]..  Currently denies dyspnea or chest pain.  She does not have a PCP and has not seen a doctor in years  EBUS aspiration of subcarinal lymph node neg. Right hilar lymph node could not be visualized Ct guided needle bx -adenoca  Review of Systems neg for any significant sore throat, dysphagia, itching, sneezing, nasal congestion or excess/ purulent secretions, fever, chills, sweats, unintended wt loss, pleuritic or exertional cp, hempoptysis, orthopnea pnd or change in chronic leg swelling. Also denies presyncope, palpitations, heartburn, abdominal pain, nausea, vomiting, diarrhea or change in bowel or urinary habits,  dysuria,hematuria, rash, arthralgias, visual complaints, headache, numbness weakness or ataxia.     Objective:   Physical Exam  Gen. Pleasant, thin woman, in no distress,Ambulates with a [facility] - no lesions, no post nasal drip Neck: No JVD, no thyromegaly, no carotid bruits Lungs: no use of accessory muscles, no dullness to percussion, clear without rales or rhonchi  Cardiovascular: Rhythm regular, heart sounds  normal, no murmurs or gallops, no peripheral edema Musculoskeletal: No deformities, no cyanosis or clubbing        Assessment & Plan:

## 2014-04-20 NOTE — Assessment & Plan Note (Signed)
non small cell lung cancer (adenocarcinoma) -not enough tissue to send for markers. Presumably stage I, no drug lymphadenopathy did not light up on PET and aspiration biopsy was negative Referral to cancer clinic -[facility] Given her general medical status and borderline lung function, she is likely a poor candidate for surgery. My bias would be to proceed with radiation therapy. I'm not sure that we can have an expectation for cure with a 3.5 cm mass with radiotherapy.

## 2014-04-20 NOTE — Patient Instructions (Signed)
You have non small cell lung cancer (adenocarcinoma) Referral to cancer clinic

## 2014-04-24 ENCOUNTER — Telehealth: Payer: Self-pay | Admitting: *Deleted

## 2014-04-24 NOTE — Telephone Encounter (Signed)
Called daughter will appt for [place] 04/27/14 at 3:45 with Rad Oncology.  She verbalized understanding of appt time and place.

## 2014-04-25 ENCOUNTER — Telehealth: Payer: Self-pay | Admitting: *Deleted

## 2014-04-25 NOTE — Telephone Encounter (Signed)
Called pt daughter with appt time change, left vm message

## 2014-04-27 ENCOUNTER — Encounter: Payer: Self-pay | Admitting: Radiation Oncology

## 2014-04-27 ENCOUNTER — Encounter: Payer: Self-pay | Admitting: *Deleted

## 2014-04-27 ENCOUNTER — Ambulatory Visit
Admission: RE | Admit: 2014-04-27 | Discharge: 2014-04-27 | Disposition: A | Discharge status: Home / Self Care | Payer: Medicare Other | Source: Ambulatory Visit | Attending: Radiation Oncology | Admitting: Radiation Oncology

## 2014-04-27 ENCOUNTER — Ambulatory Visit: Payer: Medicare Other | Attending: Radiation Oncology | Admitting: Physical Therapy

## 2014-04-27 VITALS — BP 153/78 | HR 115 | Temp 97.4°F | Resp 20 | Wt 88.7 lb

## 2014-04-27 DIAGNOSIS — C3492 Malignant neoplasm of unspecified part of left bronchus or lung: Secondary | ICD-10-CM

## 2014-04-27 NOTE — Progress Notes (Signed)
Radiation Oncology         [phone] ________________________________  Initial Outpatient Consultation  Name: Michele Johns MRN: [id number]  Date: 04/27/2014  DOB: [birth year]  CC:No PCP Per Patient  Rigoberto Noel, MD   REFERRING PHYSICIAN: Rigoberto Noel, MD  DIAGNOSIS: Clinical stage I adenocarcinoma of the left upper lung  HISTORY OF PRESENT ILLNESS::Michele Johns is a 80 y.o. female who is een out of the courtesy of Dr. Elsworth Soho as part of the multidisciplinary thoracic oncology clinic.. In May of this year the patient presented with acute right hip pain and subsequent syncopal episode. The patient was found to have a right femoral nontraumatic fracture. The patient underwent surgical correction of this issue. Patient developed hypoxemia and sinus tachycardia postop. A subsequent CT scan of the chest showed an irregular mass anterior medial left upper lobe measuring approximately 3.5 cm. Chest CT scan also showed enlarged subcarinal lymphadenopathy measuring 3.2 x 1.8 cm. Patient was also noted to have bilateral pleural effusions. A right-sided thoracentesis was performed which showed no evidence of malignancy. On June 22 the patient underwent flexible bronchoscopy brushing of the left upper lobe bronchioloalveolar lavage of LUL and endobronchial ultrasound as well as transbronchial needle aspiration of the station 7. Tissue from all this procedure revealed no evidence of malignancy. At a later date the patient underwent CT-guided biopsy of the left upper lung mass which revealed adenocarcinoma. There was no additional tissue for EGFR and ALK. Tumor appeared to be moderately differentiated. A PET scan showed hypermetabolism in the left upper lobe nodule but no other areas. Patient underwent spirometry with an FEV1 of 1.16 and FVC of 1.5. With this information the patient is now seen in the multidisciplinary clinic.  PREVIOUS RADIATION THERAPY: No  PAST MEDICAL HISTORY:  has a past medical history  of Lung mass; COPD (chronic obstructive pulmonary disease); Osteoporosis; Hypoxemia; Anemia; and History of blood transfusion (may 2015).    PAST SURGICAL HISTORY: Past Surgical History  Procedure Laterality Date  . Total hip arthroplasty Right 03/01/2014    Procedure: TOTAL HIP ARTHROPLASTY ANTERIOR APPROACH;  Surgeon: Marybelle Killings, MD;  Location: [facility];  Service: Orthopedics;  Laterality: Right;  . Cholecystectomy  yrs ago  . Endobronchial ultrasound Bilateral 04/10/2014    Procedure: ENDOBRONCHIAL ULTRASOUND;  Surgeon: Rigoberto Noel, MD;  Location: [facility];  Service: Cardiopulmonary;  Laterality: Bilateral;    FAMILY HISTORY: family history is not on file.  SOCIAL HISTORY:  reports that she quit smoking about 20 years ago. Her smoking use included Cigarettes. She has a 7.5 pack-year smoking history. She has never used smokeless tobacco. She reports that she does not drink alcohol or use illicit drugs.  ALLERGIES: Penicillins  MEDICATIONS:  Current Outpatient Prescriptions  Medication Sig Dispense Refill  . aspirin EC 325 MG tablet Take 1 tablet (325 mg total) by mouth daily.  30 tablet  0  . calcium carbonate (OS-CAL - DOSED IN MG OF ELEMENTAL CALCIUM) 1250 MG tablet Take 1 tablet (1,250 mg total) by mouth daily with breakfast.      . cholecalciferol 400 UNITS tablet Take 2 tablets (800 Units total) by mouth daily.  30 each  0  . HYDROcodone-acetaminophen (NORCO) 5-325 MG per tablet Take 1-2 tablets by mouth every 4 (four) hours as needed for moderate pain.  30 tablet  0  . Multiple Vitamin (MULTIVITAMIN WITH MINERALS) TABS tablet Take 1 tablet by mouth daily.      . naproxen sodium (ANAPROX) 220  MG tablet Take 440 mg by mouth daily.      Marland Kitchen OVER THE COUNTER MEDICATION Take 60 mLs by mouth 3 (three) times daily. Med Pass 2.0       No current facility-administered medications for this encounter.    REVIEW OF SYSTEMS:  A 15 point review of systems is documented in the electronic  medical record. This was obtained by the nursing staff. However, I reviewed this with the patient to discuss relevant findings and make appropriate changes.  Prior to diagnosis the patient denied any cough or breathing problems. She denies any history of hemoptysis. She is making good progress with her recuperation from her right hip surgery. She may be off her Lill in the next week. Patient denies any headaches dizziness or blurred vision.   PHYSICAL EXAM:  weight is 88 lb 11.2 oz (40.234 kg). Her temperature is 97.4 F (36.3 C). Her blood pressure is 153/78 and her pulse is 115. Her respiration is 20.   BP 153/78  Pulse 115  Temp(Src) 97.4 F (36.3 C)  Resp 20  Wt 88 lb 11.2 oz (40.234 kg)  General Appearance:    Alert, cooperative, no distress, appears stated age, accompanied by her daughter on evaluation today, the patient ambulates with a Caples at this time   Head:    Normocephalic, without obvious abnormality, atraumatic  Eyes:    PERRL, conjunctiva/corneas clear, EOM's intact,         Nose:   Nares normal, septum midline, mucosa normal, no drainage    or sinus tenderness  Throat:   Lips, mucosa, and tongue normal; dentures in place, gums normal  Neck:   Supple, symmetrical, trachea midline, no adenopathy;    thyroid:  no enlargement/tenderness/nodules; no carotid   bruit or JVD  Back:     Symmetric, no curvature, ROM normal, no CVA tenderness  Lungs:     Clear to auscultation bilaterally, respirations unlabored  Chest Wall:    No tenderness or deformity, mild bruising from biopsy site    Heart:    Regular rate and rhythm,        Abdomen:     Soft, non-tender, bowel sounds active all four quadrants,    no masses, no organomegaly        Extremities:   Extremities normal, atraumatic, no cyanosis or edema  Pulses:   2+ and symmetric all extremities  Skin:   Skin color, texture, turgor normal, no rashes or lesions  Lymph nodes:   Cervical, supraclavicular, and axillary nodes  normal  Neurologic:   normal strength, sensation and reflexes    throughout     ECOG = 2  0 - Asymptomatic (Fully active, able to carry on all predisease activities without restriction)  1 - Symptomatic but completely ambulatory (Restricted in physically strenuous activity but ambulatory and able to carry out work of a light or sedentary nature. For example, light housework, office work)  2 - Symptomatic, <50% in bed during the day (Ambulatory and capable of all self care but unable to carry out any work activities. Up and about more than 50% of waking hours)  3 - Symptomatic, >50% in bed, but not bedbound (Capable of only limited self-care, confined to bed or chair 50% or more of waking hours)  4 - Bedbound (Completely disabled. Cannot carry on any self-care. Totally confined to bed or chair)  5 - Death   Eustace Pen MM, Creech RH, Tormey DC, et al. [phone]). "Toxicity and response criteria of  the [facility]". [place] Oncol. 5 (6): 649-55  LABORATORY DATA:  Lab Results  Component Value Date   WBC 8.8 04/18/2014   HGB 14.7 04/18/2014   HCT 45.5 04/18/2014   MCV 93.2 04/18/2014   PLT 289 04/18/2014   NEUTROABS 6.3 03/06/2014   Lab Results  Component Value Date   NA 146 03/08/2014   K 4.7 03/08/2014   CL 106 03/08/2014   CO2 27 03/08/2014   GLUCOSE 106* 03/08/2014   CREATININE 0.68 03/08/2014   CALCIUM 9.0 03/08/2014      RADIOGRAPHY: Ct Biopsy  04/18/2014   CLINICAL DATA:  80 year old with a left upper lung lesion.  EXAM: CT GUIDED LEFT LUNG LESION BIOPSY  Physician: [name], MD  MEDICATIONS: 100 mcg fentanyl, 4 mg Versed. A radiology nurse monitored the patient for moderate sedation.  ANESTHESIA/SEDATION: Sedation time: 30 min  PROCEDURE: The procedure was explained to the patient. The risks and benefits of the procedure were discussed and the patient's questions were addressed. Informed consent was obtained from the patient. Patient was placed on her left  side. CT images through the upper chest were obtained. The lesion in the anterior left upper lobe was identified. The left anterior chest was prepped and draped in sterile fashion. Maximal barrier sterile technique was utilized including caps, mask, sterile gowns, sterile gloves, sterile drape, hand hygiene and skin antiseptic. Skin was anesthetized with 1% lidocaine. A 17 gauge needle was directed into the anterior lesion. Needle was directed lateral to the left side of the sternum. Needle position was confirmed within the lesion with CT guidance. A total of 3 core biopsies were obtained with an 18 gauge device. Specimens were placed in formalin. 17 gauge needle was removed without complication. Follow up CT images were obtained. A dressing was placed over the puncture site.  FINDINGS: Irregular shaped lesion in the anterior left upper lobe. Needle position confirmed within the lesion. No evidence for a pneumothorax following the core biopsies.  COMPLICATIONS: None  IMPRESSION: Successful CT-guided core biopsies of the left upper lobe lesion.   Electronically Signed   By: [name] M.D.   On: 04/18/2014 11:51   Dg Chest Port 1 View  04/18/2014   CLINICAL DATA:  Post left lung biopsy  EXAM: PORTABLE CHEST - 1 VIEW  COMPARISON:  Portable chest radiographs 04/10/2014.  FINDINGS: The heart size and mediastinal contours are within normal limits. No pneumothorax. Stable left upper lobe nodule. Lungs otherwise clear. Atherosclerotic calcifications in the aorta. Mild degenerative changes within the shoulders. No acute osseous abnormalities.  IMPRESSION: No pneumothorax.  Stable left upper lobe pulmonary nodule consistent with patient's history.   Electronically Signed   By: Margaree Mackintosh M.D.   On: 04/18/2014 12:55   Dg Chest Port 1 View  04/10/2014   CLINICAL DATA:  Status post lung biopsy.  EXAM: PORTABLE CHEST - 1 VIEW  COMPARISON:  Mar 05, 2014.  FINDINGS: The heart size and mediastinal contours are within  normal limits. No pneumothorax or pleural effusion is noted. Right lung is clear. Stable left apical mass is noted. The visualized skeletal structures are unremarkable.  IMPRESSION: Stable left apical mass.  No pneumothorax seen.   Electronically Signed   By: Sabino Dick M.D.   On: 04/10/2014 09:22      IMPRESSION: Clinical stage I adenocarcinoma of the left upper lung. I reviewed the patient's x-ray findings and pathologic report with the patient and her daughter. We discussed  options for treatment including definitive radiation therapy and potentially surgery. Prior to the patient's hip fracture she was fully functional and independent and she is making good progress with her recuperation from recent surgery. Patient may be a potential candidate for surgery and she will be scheduled for consultation with Dr. Servando Snare. She may decide  against surgery or not be a candidate and in that case we'll proceed with definitive radiation therapy. It does appear that we would be able to do SBRT in this situation. I would not recommend treatment of the mediastinal area since the largest lymph node was negative on biopsy and the precarinal lymph node showed minimal uptake and is interpreted as likely reactive or inflammatory.   PLAN: Thoracic surgery consult. She will likely need a MRI the brain and we may need additional tissue for EGFR and ALK.    ------------------------------------------------  Blair Promise, PhD, MD

## 2014-04-28 ENCOUNTER — Other Ambulatory Visit: Payer: Self-pay | Admitting: Radiation Oncology

## 2014-04-28 ENCOUNTER — Other Ambulatory Visit: Payer: Self-pay | Admitting: *Deleted

## 2014-04-28 ENCOUNTER — Telehealth: Payer: Self-pay | Admitting: *Deleted

## 2014-04-28 DIAGNOSIS — C3492 Malignant neoplasm of unspecified part of left bronchus or lung: Secondary | ICD-10-CM

## 2014-04-28 DIAGNOSIS — C3490 Malignant neoplasm of unspecified part of unspecified bronchus or lung: Secondary | ICD-10-CM

## 2014-04-28 NOTE — Telephone Encounter (Signed)
Called pt daughter with appt for MRI Aaron Edelman.  She verbalized understanding.

## 2014-05-04 ENCOUNTER — Institutional Professional Consult (permissible substitution) (INDEPENDENT_AMBULATORY_CARE_PROVIDER_SITE_OTHER): Payer: Medicare Other | Admitting: Cardiothoracic Surgery

## 2014-05-04 ENCOUNTER — Encounter: Payer: Self-pay | Admitting: Cardiothoracic Surgery

## 2014-05-04 ENCOUNTER — Telehealth: Payer: Self-pay | Admitting: *Deleted

## 2014-05-04 ENCOUNTER — Encounter: Payer: Self-pay | Admitting: *Deleted

## 2014-05-04 VITALS — BP 138/71 | HR 108 | Resp 16 | Ht 62.0 in | Wt 88.0 lb

## 2014-05-04 DIAGNOSIS — C3410 Malignant neoplasm of upper lobe, unspecified bronchus or lung: Secondary | ICD-10-CM

## 2014-05-04 NOTE — Progress Notes (Signed)
[address]       [place] [address]             [phone]                    Michele Johns [place] Medical Record #[id number] Date of Birth: Jul 06, 1930  Referring: Rigoberto Noel, MD Primary Care: No PCP Per Patient  Chief Complaint:   Lung cancer left upper lobe  History of Present Illness:    Michele Johns 80 y.o. female is seen in the office  today for left upper lobe lung cancer. He may the patient was having right hip pain and had a spontaneous fracture of the right hip. This led to a hospitalization and right hip replacement complicated by postoperative respiratory failure, including bilateral pleural effusions right greater than left. A right thoracentesis was done. In addition a CT scan done to rule out pulmonary embolus revealed a left upper lobe mass suspicious for carcinoma. A followup PET scan was done. Bronchoscopy with ebus with biopsy of #7 nodes by Dr. Elsworth Soho revealed no diagnosis. Needle biopsy of the left upper lobe mass was performed and found to be moderately differentiated adenocarcinoma. The patient has seen Dr. Dwana Curd for consideration of stereotactic radiotherapy for clinically stage I adenocarcinoma the lung.      Current Activity/ Functional Status:  Patient is not independent with mobility/ambulation, transfers, ADL's, IADL's.   Zubrod Score: At the time of surgery this patient's most appropriate activity status/level should be described as: []     0    Normal activity, no symptoms []     1    Restricted in physical strenuous activity but ambulatory, able to do out light work [x]     2    Ambulatory and capable of self care, unable to do work activities, up and about               >50 % of waking hours                              []     3    Only limited self care, in bed greater than 50% of waking hours []     4    Completely disabled, no self care, confined to bed or chair []     5    Moribund   Past Medical History  Diagnosis  Date  . Lung mass   . COPD (chronic obstructive pulmonary disease)   . Osteoporosis   . Hypoxemia   . Anemia   . History of blood transfusion may 2015    Past Surgical History  Procedure Laterality Date  . Total hip arthroplasty Right 03/01/2014    Procedure: TOTAL HIP ARTHROPLASTY ANTERIOR APPROACH;  Surgeon: Marybelle Killings, MD;  Location: [facility];  Service: Orthopedics;  Laterality: Right;  . Cholecystectomy  yrs ago  . Endobronchial ultrasound Bilateral 04/10/2014    Procedure: ENDOBRONCHIAL ULTRASOUND;  Surgeon: Rigoberto Noel, MD;  Location: [facility];  Service: Cardiopulmonary;  Laterality: Bilateral;    No family history on file.  History   Social History  . Marital Status: Widowed    Spouse Name: N/A    Number of Children: N/A  . Years of Education: N/A   Occupational History  . Not on file.   Social History Main Topics  . Smoking status: Former Smoker -- 0.50 packs/day for 15 years  Types: Cigarettes    Quit date: 10/20/1993  . Smokeless tobacco: Never Used  . Alcohol Use: No  . Drug Use: No  . Sexual Activity: Not on file   Other Topics Concern  . Not on file   Social History Narrative  . No narrative on file    History  Smoking status  . Former Smoker -- 0.50 packs/day for 15 years  . Types: Cigarettes  . Quit date: 10/20/1993  Smokeless tobacco  . Never Used    History  Alcohol Use No     Allergies  Allergen Reactions  . Penicillins Other (See Comments)    Reaction: blisters on leg    Current Outpatient Prescriptions  Medication Sig Dispense Refill  . calcium carbonate (OS-CAL - DOSED IN MG OF ELEMENTAL CALCIUM) 1250 MG tablet Take 1 tablet (1,250 mg total) by mouth daily with breakfast.      . cholecalciferol 400 UNITS tablet Take 2 tablets (800 Units total) by mouth daily.  30 each  0  . Multiple Vitamin (MULTIVITAMIN WITH MINERALS) TABS tablet Take 1 tablet by mouth daily.      Marland Kitchen OVER THE COUNTER MEDICATION Take 60 mLs by mouth 3  (three) times daily. Med Pass 2.0       No current facility-administered medications for this visit.     Review of Systems:     Cardiac Review of Systems: Y or N  Chest Pain [  n  ]  Resting SOB [ y  ] Exertional SOB  Blue.Reese  ]  Vertell Limber Florencio.Farrier  ]   Pedal Edema [ y  ]    Palpitations [ n ] Syncope  [ n ]   Presyncope [ n  ]  General Review of Systems: [Y] = yes [  ]=no Constitional: recent weight change Blue.Reese  ];  Wt loss over the last 3 months [ water wt   ] anorexia [ y ]; fatigue [ y ]; nausea [  ]; night sweats [  ]; fever [  ]; or chills [  ];          Dental: poor dentition[  ]; Last Dentist visit:   Eye : blurred vision [  ]; diplopia [   ]; vision changes [  ];  Amaurosis fugax[  ]; Resp: cough [  ];  wheezing[  ];  hemoptysis[  ]; shortness of breath[y  ]; paroxysmal nocturnal dyspnea[y  ]; dyspnea on exertion[  ]; or orthopnea[  ];  GI:  gallstones[  ], vomiting[  ];  dysphagia[  ]; melena[  ];  hematochezia [  ]; heartburn[  ];   Hx of  Colonoscopy[  ]; GU: kidney stones [  ]; hematuria[  ];   dysuria [  ];  nocturia[  ];  history of     obstruction [  ]; urinary frequency [  ]             Skin: rash, swelling[  ];, hair loss[  ];  peripheral edema[  ];  or itching[  ]; Musculosketetal: myalgias[  ];  joint swelling[y  ];  joint erythema[ y ];  joint pain[  ];  back pain[  ];  Heme/Lymph: bruising[  ];  bleeding[  ];  anemia[  ];  Neuro: TIA[  ];  headaches[  ];  stroke[  ];  vertigo[  ];  seizures[  ];   paresthesias[  ];  difficulty walking[  ];  Psych:depression[  ]; anxiety[  ];  Endocrine: diabetes[  ];  thyroid dysfunction[  ];  Immunizations: Flu up to date [ ? ]; Pneumococcal up to date [ ? ];  Other:  Physical Exam: BP 138/71  Pulse 108  Resp 16  Ht 5\' 2"  (1.575 m)  Wt 88 lb (39.917 kg)  BMI 16.09 kg/m2  SpO2 96%  PHYSICAL EXAMINATION:  General appearance: alert, cooperative, appears older than stated age, cachectic and no distress Neurologic: intact Heart: regular  rate and rhythm, S1, S2 normal, no murmur, click, rub or gallop Lungs: diminished breath sounds bibasilar Abdomen: soft, non-tender; bowel sounds normal; no masses,  no organomegaly Extremities: extremities normal, atraumatic, no cyanosis or edema and Homans sign is negative, no sign of DVT Patient has no cervical or supraclavicular adenopathy, no carotid bruits   Diagnostic Studies & Laboratory data:     Recent Radiology Findings:  Ct Biopsy  04/18/2014   CLINICAL DATA:  80 year old with a left upper lung lesion.  EXAM: CT GUIDED LEFT LUNG LESION BIOPSY  Physician: [name], MD  MEDICATIONS: 100 mcg fentanyl, 4 mg Versed. A radiology nurse monitored the patient for moderate sedation.  ANESTHESIA/SEDATION: Sedation time: 30 min  PROCEDURE: The procedure was explained to the patient. The risks and benefits of the procedure were discussed and the patient's questions were addressed. Informed consent was obtained from the patient. Patient was placed on her left side. CT images through the upper chest were obtained. The lesion in the anterior left upper lobe was identified. The left anterior chest was prepped and draped in sterile fashion. Maximal barrier sterile technique was utilized including caps, mask, sterile gowns, sterile gloves, sterile drape, hand hygiene and skin antiseptic. Skin was anesthetized with 1% lidocaine. A 17 gauge needle was directed into the anterior lesion. Needle was directed lateral to the left side of the sternum. Needle position was confirmed within the lesion with CT guidance. A total of 3 core biopsies were obtained with an 18 gauge device. Specimens were placed in formalin. 17 gauge needle was removed without complication. Follow up CT images were obtained. A dressing was placed over the puncture site.  FINDINGS: Irregular shaped lesion in the anterior left upper lobe. Needle position confirmed within the lesion. No evidence for a pneumothorax following the core biopsies.   COMPLICATIONS: None  IMPRESSION: Successful CT-guided core biopsies of the left upper lobe lesion.   Electronically Signed   By: [name] M.D.   On: 04/18/2014 11:51    CLINICAL DATA: Initial treatment strategy for lung mass.  EXAM:  NUCLEAR MEDICINE PET SKULL BASE TO THIGH  TECHNIQUE:  6.5 mCi F-18 FDG was injected intravenously. Full-ring PET imaging  was performed from the skull base to thigh after the radiotracer. CT  data was obtained and used for attenuation correction and anatomic  localization.  FASTING BLOOD GLUCOSE: Value: 110 mg/dl  COMPARISON: CT 03/04/2014, plain films pelvis 03/07/2014  FINDINGS:  NECK  No hypermetabolic lymph nodes in the neck.  Insert bone recess  CHEST  Within the left upper lobe there is a hypermetabolic nodule  measuring 22 x 19 mm (image 50, series 4) with intense metabolic  activity (SUV max 11.2). This nodule abuts the mediastinal pleural  surface through thin connection. No additional hypermetabolic  pulmonary nodules. There are bilateral pleural effusions.  No hypermetabolic mediastinal lymph nodes. There is a diffuse  metabolic activity through the esophagus without evidence of mass  lesion. There is a precarinal lymph noted measuring 9 mm short axis  (  image 61, series 4) with mild metabolic activity (SUV max 2.5).  ABDOMEN/PELVIS  No abnormal hypermetabolic activity within the liver, pancreas,  adrenal glands, or spleen. No hypermetabolic lymph nodes in the  abdomen or pelvis.  SKELETON  No focal hypermetabolic activity to suggest skeletal metastasis.  There is hypermetabolic activity associated with the right  trochanteric fracture on the right. Recent right hip arthroplasty.  Small seroma present.  1. Hypermetabolic left upper lobe nodule consistent with primary  bronchogenic carcinoma.  2. Mild metabolic activity associated with precarinal lymph node.  Favor reactive lymph node.  3. Staging by FDG PET imaging T1b N0 M 0.  4.  Diffuse activity within esophagus suggests esophagitis.  5. Bilateral pleural effusions.  Electronically Signed  By: Suzy Bouchard M.D.  On: 03/14/2014 10:25      Recent Lab Findings: Lab Results  Component Value Date   WBC 8.8 04/18/2014   HGB 14.7 04/18/2014   HCT 45.5 04/18/2014   PLT 289 04/18/2014   GLUCOSE 106* 03/08/2014   ALT 11 03/07/2014   AST 23 03/07/2014   NA 146 03/08/2014   K 4.7 03/08/2014   CL 106 03/08/2014   CREATININE 0.68 03/08/2014   BUN 17 03/08/2014   CO2 27 03/08/2014   INR 1.05 04/18/2014   HGBA1C 5.6 03/01/2014   Screening spirometry done in the pulmonary office reveals an FEV1 of 1.167% predicted, diffusion capacity not done   Assessment / Plan:     The patient is a very frail appearing 80 year old with limited pulmonary reserve. She comes into the office having artery decided that she's not interested in having any surgical resection performed. Consider her complicated preoperative course frail condition now recovering from hip fracture it is not unreasonable to proceed to treat her clinical stage I lung cancer with stereotactic radio therapy to the left upper lobe lesion. This was discussed with the patient and her daughter in detail.  She wishes to proceed with stereotactic radiotherapy in the near future.   I spent 40 minutes counseling the patient face to face. The total time spent in the appointment was 60 minutes.  [name] MD      [address] [place] [address] Office [phone]   Beeper [phone]  05/04/2014 1:45 PM

## 2014-05-04 NOTE — Progress Notes (Signed)
Called pt to remind them of appt with Dr. Servando Snare.  Daughter verbalized understanding

## 2014-05-05 LAB — FUNGUS CULTURE W SMEAR: Fungal Smear: NONE SEEN

## 2014-05-08 ENCOUNTER — Ambulatory Visit (HOSPITAL_COMMUNITY)
Admission: RE | Admit: 2014-05-08 | Discharge: 2014-05-08 | Disposition: A | Discharge status: Home / Self Care | Payer: Medicare Other | Source: Ambulatory Visit | Attending: Radiation Oncology | Admitting: Radiation Oncology

## 2014-05-08 ENCOUNTER — Encounter: Payer: Self-pay | Admitting: Radiation Oncology

## 2014-05-08 DIAGNOSIS — M899 Disorder of bone, unspecified: Secondary | ICD-10-CM | POA: Insufficient documentation

## 2014-05-08 DIAGNOSIS — C3492 Malignant neoplasm of unspecified part of left bronchus or lung: Secondary | ICD-10-CM

## 2014-05-08 DIAGNOSIS — C3490 Malignant neoplasm of unspecified part of unspecified bronchus or lung: Secondary | ICD-10-CM | POA: Diagnosis not present

## 2014-05-08 DIAGNOSIS — M949 Disorder of cartilage, unspecified: Secondary | ICD-10-CM | POA: Diagnosis not present

## 2014-05-08 LAB — POCT I-STAT CREATININE: Creatinine, Ser: 0.8 mg/dL (ref 0.50–1.10)

## 2014-05-08 MED ORDER — GADOBENATE DIMEGLUMINE 529 MG/ML IV SOLN: 10.0000 mL | Freq: Once | INTRAVENOUS | Status: AC | PRN

## 2014-05-08 MED ORDER — GADOBENATE DIMEGLUMINE 529 MG/ML IV SOLN
10.0000 mL | Freq: Once | INTRAVENOUS | Status: AC | PRN
  Administered 2014-05-08: 8 mL via INTRAVENOUS

## 2014-05-08 NOTE — Progress Notes (Signed)
Thoracic Location of Tumor / Histology: Clinical stage I adenocarcinoma of the left upper lung  Patient presented In May of this year the patient presented with acute right hip pain and subsequent syncopal episode. The patient was found to have a right femoral nontraumatic fracture. The patient underwent surgical correction of this issue. Patient developed hypoxemia and sinus tachycardia postop. A subsequent CT scan of the chest showed an irregular mass anterior medial left upper lobe measuring approximately 3.5 cm. Chest CT scan also showed enlarged subcarinal lymphadenopathy measuring 3.2 x 1.8 cm.   Biopsies revealed:   04/18/14 Diagnosis Lung, needle/core biopsy(ies), left upper lobe - ADENOCARCINOMA.  Tobacco/Marijuana/Snuff/ETOH use: reports that she quit smoking about 20 years ago. Her smoking use included Cigarettes. She has a 7.5 pack-year smoking history. She has never used smokeless tobacco. She reports that she does not drink alcohol or use illicit drugs.  Past/Anticipated interventions by cardiothoracic surgery, if any: 04/10/14 - Procedure: ENDOBRONCHIAL ULTRASOUND;  Surgeon: Rigoberto Noel, MD;  Location: [facility];  Service: Cardiopulmonary;  Laterality: Bilateral;, 04/18/14 - CT Biopsy of left upper lung mass   Past/Anticipated interventions by medical oncology, if any: no  Signs/Symptoms  Weight changes, if any: yes has lost more than 10 lbs over last 3 months.  States she is not hungry.  Respiratory complaints, if any: no  Hemoptysis, if any: no  Pain issues, if any:  Yes, her right hip hurts in the afternoon.  SAFETY ISSUES:  Prior radiation? no  Pacemaker/ICD? no   Possible current pregnancy?no  Is the patient on methotrexate? no  Current Complaints / other details:  Patient is here with her daughter.  She came in a wheelchair but is able to use a Seats at home.  She denies any respiratory complaints.

## 2014-05-10 ENCOUNTER — Encounter: Payer: Self-pay | Admitting: Radiation Oncology

## 2014-05-10 ENCOUNTER — Ambulatory Visit
Admission: RE | Admit: 2014-05-10 | Discharge: 2014-05-10 | Disposition: A | Discharge status: Home / Self Care | Payer: Medicare Other | Source: Ambulatory Visit | Attending: Radiation Oncology | Admitting: Radiation Oncology

## 2014-05-10 VITALS — BP 146/67 | HR 89 | Temp 97.4°F | Ht 62.0 in | Wt 88.7 lb

## 2014-05-10 DIAGNOSIS — C3410 Malignant neoplasm of upper lobe, unspecified bronchus or lung: Secondary | ICD-10-CM

## 2014-05-10 DIAGNOSIS — C3492 Malignant neoplasm of unspecified part of left bronchus or lung: Secondary | ICD-10-CM

## 2014-05-10 DIAGNOSIS — Z510 Encounter for antineoplastic radiation therapy: Secondary | ICD-10-CM | POA: Insufficient documentation

## 2014-05-10 HISTORY — DX: Malignant neoplasm of upper lobe, unspecified bronchus or lung: C34.10

## 2014-05-10 NOTE — Progress Notes (Signed)
Please see the Nurse Progress Note in the MD Initial Consult Encounter for this patient. 

## 2014-05-10 NOTE — Progress Notes (Signed)
Radiation Oncology         [phone] ________________________________  Name: Michele Johns MRN: [id number]  Date: 05/10/2014  DOB: March 20, 1930  Re-evaluation Note  CC: No PCP Per Patient  [name], MD  Diagnosis:   Clinical stage I adenocarcinoma of the left upper lung    Narrative:  The patient returns today for further evaluation. Patient was seen by thoracic surgery. She was not felt to be a good candidate for resection of her left upper lobe adenocarcinoma.  The patient has completed her staging workup with the MRI the brain. This showed no parenchymal metastasis but a questionable lesion in the left temporal bone concerning for possible early metastasis.  Patient denies any shortness of breath or pain within the chest area. She denies any significant cough or hemoptysis. Patient denies any pain along the left temporal skull area.                            ALLERGIES:  is allergic to penicillins.  Meds: Current Outpatient Prescriptions  Medication Sig Dispense Refill  . calcium carbonate (OS-CAL - DOSED IN MG OF ELEMENTAL CALCIUM) 1250 MG tablet Take 1 tablet (1,250 mg total) by mouth daily with breakfast.      . cholecalciferol 400 UNITS tablet Take 2 tablets (800 Units total) by mouth daily.  30 each  0  . Multiple Vitamin (MULTIVITAMIN WITH MINERALS) TABS tablet Take 1 tablet by mouth daily.       No current facility-administered medications for this encounter.    Physical Findings: The patient is in no acute distress. Patient is alert and oriented.  height is 5\' 2"  (1.575 m) and weight is 88 lb 11.2 oz (40.234 kg). Her oral temperature is 97.4 F (36.3 C). Her blood pressure is 146/67 and her pulse is 89. Her oxygen saturation is 97%. .  The lungs are clear. The heart has a regular rhythm and rate. No palpable supraclavicular or axillary adenopathy. No palpable mass or tenderness is appreciated along the left skull region.  Lab Findings: Lab Results  Component  Value Date   WBC 8.8 04/18/2014   HGB 14.7 04/18/2014   HCT 45.5 04/18/2014   MCV 93.2 04/18/2014   PLT 289 04/18/2014      Radiographic Findings: Mr [name] Contrast  05/08/2014   CLINICAL DATA:  80 year old female with lung cancer, staging. Initial encounter.  EXAM: MRI HEAD WITHOUT AND WITH CONTRAST  TECHNIQUE: Multiplanar, multiecho pulse sequences of the brain and surrounding structures were obtained without and with intravenous contrast.  CONTRAST:  92mL MULTIHANCE GADOBENATE DIMEGLUMINE 529 MG/ML IV SOLN  COMPARISON:  None.  FINDINGS: Incidental developmental venous anomaly right cerebellar hemisphere. Series 11, image 17.  No abnormal enhancement identified. No midline shift, mass effect, or evidence of intracranial mass lesion.  Cerebral volume is within normal limits for age. No restricted diffusion to suggest acute infarction. No midline shift, mass effect, evidence of mass lesion, ventriculomegaly, extra-axial collection or acute intracranial hemorrhage. Cervicomedullary junction and pituitary are within normal limits. Grossly negative visualized cervical spine. Major intracranial vascular flow voids are preserved. Mild for age scattered nonspecific mostly periventricular white matter T2 and FLAIR hyperintensity. Similar patchy T2 hyperintensity in the pons.  Small oval 9 mm enhancing left temporal bone lesion with conspicuous diffusion signal (series 11 image there is degree page 16). Bone marrow signal elsewhere within normal limits.  Visualized scalp soft tissues are within normal  limits. Visible internal auditory structures appear normal. Mastoids are clear. Paranasal sinuses are clear. Postoperative changes to the globes.  IMPRESSION: 1. No metastatic disease to the brain. 2. Small 9 mm left temporal bone lesion, suspicious for early osseous metastatic disease to the skull.   Electronically Signed   By: [name] M.D.   On: 05/08/2014 15:59   Ct Biopsy  04/18/2014   CLINICAL DATA:   80 year old with a left upper lung lesion.  EXAM: CT GUIDED LEFT LUNG LESION BIOPSY  Physician: [name], MD  MEDICATIONS: 100 mcg fentanyl, 4 mg Versed. A radiology nurse monitored the patient for moderate sedation.  ANESTHESIA/SEDATION: Sedation time: 30 min  PROCEDURE: The procedure was explained to the patient. The risks and benefits of the procedure were discussed and the patient's questions were addressed. Informed consent was obtained from the patient. Patient was placed on her left side. CT images through the upper chest were obtained. The lesion in the anterior left upper lobe was identified. The left anterior chest was prepped and draped in sterile fashion. Maximal barrier sterile technique was utilized including caps, mask, sterile gowns, sterile gloves, sterile drape, hand hygiene and skin antiseptic. Skin was anesthetized with 1% lidocaine. A 17 gauge needle was directed into the anterior lesion. Needle was directed lateral to the left side of the sternum. Needle position was confirmed within the lesion with CT guidance. A total of 3 core biopsies were obtained with an 18 gauge device. Specimens were placed in formalin. 17 gauge needle was removed without complication. Follow up CT images were obtained. A dressing was placed over the puncture site.  FINDINGS: Irregular shaped lesion in the anterior left upper lobe. Needle position confirmed within the lesion. No evidence for a pneumothorax following the core biopsies.  COMPLICATIONS: None  IMPRESSION: Successful CT-guided core biopsies of the left upper lobe lesion.   Electronically Signed   By: [name] M.D.   On: 04/18/2014 11:51   Dg Chest Port 1 View  04/18/2014   CLINICAL DATA:  Post left lung biopsy  EXAM: PORTABLE CHEST - 1 VIEW  COMPARISON:  Portable chest radiographs 04/10/2014.  FINDINGS: The heart size and mediastinal contours are within normal limits. No pneumothorax. Stable left upper lobe nodule. Lungs otherwise clear.  Atherosclerotic calcifications in the aorta. Mild degenerative changes within the shoulders. No acute osseous abnormalities.  IMPRESSION: No pneumothorax.  Stable left upper lobe pulmonary nodule consistent with patient's history.   Electronically Signed   By: Margaree Mackintosh M.D.   On: 04/18/2014 12:55   Dg Chest Port 1 View  04/10/2014   CLINICAL DATA:  Status post lung biopsy.  EXAM: PORTABLE CHEST - 1 VIEW  COMPARISON:  Mar 05, 2014.  FINDINGS: The heart size and mediastinal contours are within normal limits. No pneumothorax or pleural effusion is noted. Right lung is clear. Stable left apical mass is noted. The visualized skeletal structures are unremarkable.  IMPRESSION: Stable left apical mass.  No pneumothorax seen.   Electronically Signed   By: Sabino Dick M.D.   On: 04/10/2014 09:22    Impression:  Clinical stage I adenocarcinoma lung with possible early metastasis to the left temporal skull. Patient's x-rays will be presented at the multidisciplinary thoracic oncology conference tomorrow morning. My inclination is to proceed with definitive treatment for the patient's left upper lung adenocarcinoma and to follow the questionable skull lesion with serial MRIs but will await conference input tomorrow morning.  Plan:  The patient is  tentatively scheduled for stereotactic body radiation therapy simulation July 28 at 1 PM.  ____________________________________ Blair Promise, MD

## 2014-05-12 NOTE — Addendum Note (Signed)
Encounter addended by: Jacqulyn Liner, RN on: 05/12/2014  1:29 PM<BR>     Documentation filed: Charges VN, Visit Diagnoses

## 2014-05-16 ENCOUNTER — Ambulatory Visit
Admission: RE | Admit: 2014-05-16 | Discharge: 2014-05-16 | Disposition: A | Discharge status: Home / Self Care | Payer: Medicare Other | Source: Ambulatory Visit | Attending: Radiation Oncology | Admitting: Radiation Oncology

## 2014-05-16 DIAGNOSIS — C3492 Malignant neoplasm of unspecified part of left bronchus or lung: Secondary | ICD-10-CM

## 2014-05-16 DIAGNOSIS — Z510 Encounter for antineoplastic radiation therapy: Secondary | ICD-10-CM | POA: Diagnosis not present

## 2014-05-19 NOTE — Progress Notes (Signed)
  Radiation Oncology         [phone] ________________________________  Name: Michele Johns MRN: [id number]  Date: 05/16/2014  DOB: Dec 05, 1929  SIMULATION AND TREATMENT PLANNING NOTE  DIAGNOSIS:  Clinical stage I adenocarcinoma of the left upper lung   NARRATIVE:  The patient was brought to the [facility].  Identity was confirmed.  All relevant records and images related to the planned course of therapy were reviewed.  The patient freely provided informed written consent to proceed with treatment after reviewing the details related to the planned course of therapy. The consent form was witnessed and verified by the simulation staff.  Then, the patient was set-up in a stable reproducible  supine position for radiation therapy.  CT images were obtained.  Surface markings were placed.  The CT images were loaded into the planning software.  Then the target and avoidance structures were contoured.  Treatment planning then occurred.  The radiation prescription was entered and confirmed.  Then, I designed and supervised the construction of a total of 2 medically necessary complex treatment devices.  I have requested : This treatment constitutes a Special Treatment Procedure for the following reason: [ High dose per fraction requiring special monitoring for increased toxicities of treatment including daily imaging..  I have ordered:dose calc.  PLAN:  The patient will receive 50 Gy in 10 fractions using SBRT treatment techniques.  ________________________________  -----------------------------------  Blair Promise, PhD, MD

## 2014-05-19 NOTE — Progress Notes (Signed)
  Radiation Oncology         [phone] ________________________________  Name: Michele Johns MRN: [id number]  Date: 05/16/2014  DOB: [birth year]  RESPIRATORY MOTION MANAGEMENT SIMULATION  NARRATIVE:  In order to account for effect of respiratory motion on target structures and other organs in the planning and delivery of radiotherapy, this patient underwent respiratory motion management simulation.  To accomplish this, when the patient was brought to the CT simulation planning suite, 4D respiratoy motion management CT images were obtained.  The CT images were loaded into the planning software.  Then, using a variety of tools including Cine, MIP, and standard views, the target volume and planning target volumes (PTV) were delineated.  Avoidance structures were contoured.  Treatment planning then occurred.  Dose volume histograms were generated and reviewed for each of the requested structure.  The resulting plan was carefully reviewed and approved today.  -----------------------------------  Blair Promise, PhD, MD

## 2014-05-23 ENCOUNTER — Telehealth: Payer: Self-pay | Admitting: Oncology

## 2014-05-23 ENCOUNTER — Other Ambulatory Visit (HOSPITAL_COMMUNITY): Payer: Self-pay | Admitting: Orthopaedic Surgery

## 2014-05-23 ENCOUNTER — Encounter (HOSPITAL_COMMUNITY): Payer: Self-pay | Admitting: *Deleted

## 2014-05-23 LAB — AFB CULTURE WITH SMEAR (NOT AT ARMC): ACID FAST SMEAR: NONE SEEN

## 2014-05-23 NOTE — Progress Notes (Signed)
This was entered on wrong patient.

## 2014-05-23 NOTE — Telephone Encounter (Signed)
Left a message with Michele Johns that her message regarding Gaile Allmon had been received.

## 2014-05-23 NOTE — H&P (Signed)
[facility]   A Division of [employer]   [address], [place], [place] [address] Telephone: [phone]  Fax: [phone]     PATIENT: Michele Johns, Michele Johns   MR#: [id number]  DOB: Dec 13, 1929       A 80 year old female returns.  She states she has had considerable increase in her hip pain, has been present for the last few days.  She has had pain and noticed some pain that radiates down to her knee, limited use of her legs.  She does not recall any falling episodes.  She had a femoral neck fracture, underwent total hip arthroplasty 03/01/2014.  She denies any fever or chills.  No numbness or tingling.  No associated knee swelling.   CURRENT MEDICATIONS:   Include 1 aspirin a day, Aleve 1 a day, multivitamin and vitamin D.   ALLERGIES:   Penicillin.   PAST SURGICAL HISTORY:   Include gallbladder removal and total hip arthroplasty for femoral neck fracture 03/01/2014.     SOCIAL HISTORY:   The patient is retired.  Widow.  Here with her daughter.  She has been a 20 pack year smoker, now smokes less than half pack per day.   REVIEW OF SYSTEMS:   Positive for cataracts, negative for diabetes, negative for DVT, emphysema.   PHYSICAL EXAMINATION:  The patient is alert and oriented, thin, 5 feet 2 inches, 91 pounds.  Extraocular movements intact.  Incision shows no cellulitis, it is not warm.  She has slight puffiness and tenderness over the trochanter with palpation, pain with hip range of motion.  Distal pulses are 2+.  No accessory muscle inspiratory effort.  Heart:  Regular rate and rhythm without murmur.   RADIOGRAPHS:   X-rays show that she has a fracture of the subtroch region which is new.  There is scalloping on the endosteal surface consistent with infection, bone resorption around the trochanter without obvious loosening on the acetabular side.  She had 2 screws present for acetabular fixation.   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   PLAN:  We will obtain lab work, CBC, sed rate, CRP.  Schedule her for hip aspirate and then she will have removal of prosthesis, antibiotic PROSTALAC spacer present and then come back later for revision surgery once she is stable.   I discussed with the patient and her daughter, she likely is going to need a PICC line for long term antibiotics IV based on cultures.  We will set this surgical procedure up next week based on findings for lab work.  The patient also has had a lung lesion that was recently found on a chest x-ray.  It is possible that she may have a tumor that is present around this or she may have seated her hip with tumor which is another possibility.  She does not have any drainage from the hip and will obtain cultures and also obtain tissue for pathology to have this reviewed.     ADDENDUM:  White blood count 8,300, 88 granulocytes, 16 absolute granulocytes, 6 lymphocytes.  Sed rate was 30 which is elevated and CRP was 3.  The patient is starting her radiation treatment next week.           Yerick Eggebrecht C. Lorin Mercy, M.D.    Auto-Authenticated by Thana Farr. Lorin Mercy, M.D. Patient has recent Dx lung cancer and with bone destruction in femur this may be from metastatic disease vs. Infection or both. Plan path and cultures  at time of surgery.

## 2014-05-23 NOTE — Telephone Encounter (Signed)
Michele Johns from [place] - Dr. Rodell Perna office called and reported that Michele Johns has an infected right total hip arthroplasty.  She is being admitted to [facility] and will have surgery tomorrow.  Dr. Lorin Mercy wants to make Dr. Sondra Come aware that patient is having surgery since she is supposed to start SBRT on 05/30/14.   She requested a call back at [phone].

## 2014-05-24 ENCOUNTER — Telehealth: Payer: Self-pay | Admitting: Oncology

## 2014-05-24 ENCOUNTER — Inpatient Hospital Stay (HOSPITAL_COMMUNITY): Payer: Medicare Other

## 2014-05-24 ENCOUNTER — Encounter (HOSPITAL_COMMUNITY): Payer: Self-pay | Admitting: *Deleted

## 2014-05-24 ENCOUNTER — Encounter (HOSPITAL_COMMUNITY): Payer: Medicare Other | Admitting: Anesthesiology

## 2014-05-24 ENCOUNTER — Inpatient Hospital Stay (HOSPITAL_COMMUNITY)
Admission: RE | Admit: 2014-05-24 | Discharge: 2014-05-29 | DRG: 466 | Disposition: A | Discharge status: Skilled Nursing Facility | Payer: Medicare Other | Source: Ambulatory Visit | Attending: Orthopaedic Surgery | Admitting: Orthopaedic Surgery

## 2014-05-24 ENCOUNTER — Inpatient Hospital Stay (HOSPITAL_COMMUNITY): Payer: Medicare Other | Admitting: Anesthesiology

## 2014-05-24 ENCOUNTER — Encounter (HOSPITAL_COMMUNITY)
Admission: RE | Disposition: A | Discharge status: Skilled Nursing Facility | Payer: Self-pay | Source: Ambulatory Visit | Attending: Orthopaedic Surgery

## 2014-05-24 ENCOUNTER — Encounter (HOSPITAL_COMMUNITY): Payer: Self-pay | Admitting: Pharmacy Technician

## 2014-05-24 DIAGNOSIS — Y831 Surgical operation with implant of artificial internal device as the cause of abnormal reaction of the patient, or of later complication, without mention of misadventure at the time of the procedure: Secondary | ICD-10-CM | POA: Diagnosis present

## 2014-05-24 DIAGNOSIS — M84453A Pathological fracture, unspecified femur, initial encounter for fracture: Secondary | ICD-10-CM | POA: Diagnosis present

## 2014-05-24 DIAGNOSIS — F17200 Nicotine dependence, unspecified, uncomplicated: Secondary | ICD-10-CM | POA: Diagnosis present

## 2014-05-24 DIAGNOSIS — D489 Neoplasm of uncertain behavior, unspecified: Secondary | ICD-10-CM

## 2014-05-24 DIAGNOSIS — J449 Chronic obstructive pulmonary disease, unspecified: Secondary | ICD-10-CM | POA: Diagnosis present

## 2014-05-24 DIAGNOSIS — E43 Unspecified severe protein-calorie malnutrition: Secondary | ICD-10-CM | POA: Diagnosis present

## 2014-05-24 DIAGNOSIS — D62 Acute posthemorrhagic anemia: Secondary | ICD-10-CM | POA: Diagnosis not present

## 2014-05-24 DIAGNOSIS — Z681 Body mass index (BMI) 19 or less, adult: Secondary | ICD-10-CM

## 2014-05-24 DIAGNOSIS — T8450XA Infection and inflammatory reaction due to unspecified internal joint prosthesis, initial encounter: Principal | ICD-10-CM | POA: Diagnosis present

## 2014-05-24 DIAGNOSIS — J4489 Other specified chronic obstructive pulmonary disease: Secondary | ICD-10-CM | POA: Diagnosis present

## 2014-05-24 DIAGNOSIS — M84459A Pathological fracture, hip, unspecified, initial encounter for fracture: Secondary | ICD-10-CM

## 2014-05-24 DIAGNOSIS — M216X9 Other acquired deformities of unspecified foot: Secondary | ICD-10-CM | POA: Diagnosis present

## 2014-05-24 DIAGNOSIS — M810 Age-related osteoporosis without current pathological fracture: Secondary | ICD-10-CM | POA: Diagnosis present

## 2014-05-24 DIAGNOSIS — M25559 Pain in unspecified hip: Secondary | ICD-10-CM | POA: Diagnosis present

## 2014-05-24 DIAGNOSIS — M84559A Pathological fracture in neoplastic disease, hip, unspecified, initial encounter for fracture: Secondary | ICD-10-CM

## 2014-05-24 HISTORY — PX: TOTAL HIP ARTHROPLASTY WITH HARDWARE REMOVAL: SHX6438

## 2014-05-24 HISTORY — PX: REVISION TOTAL HIP ARTHROPLASTY: SHX766

## 2014-05-24 LAB — COMPREHENSIVE METABOLIC PANEL
ALK PHOS: 171 U/L — AB (ref 39–117)
ALT: 26 U/L (ref 0–35)
AST: 36 U/L (ref 0–37)
Albumin: 2.9 g/dL — ABNORMAL LOW (ref 3.5–5.2)
Anion gap: 14 (ref 5–15)
BUN: 14 mg/dL (ref 6–23)
CHLORIDE: 103 meq/L (ref 96–112)
CO2: 28 meq/L (ref 19–32)
Calcium: 11.3 mg/dL — ABNORMAL HIGH (ref 8.4–10.5)
Creatinine, Ser: 0.64 mg/dL (ref 0.50–1.10)
GFR, EST NON AFRICAN AMERICAN: 81 mL/min — AB (ref >90)
GLUCOSE: 129 mg/dL — AB (ref 70–99)
POTASSIUM: 5.1 meq/L (ref 3.7–5.3)
Sodium: 145 mEq/L (ref 137–147)
Total Bilirubin: 0.7 mg/dL (ref 0.3–1.2)
Total Protein: 6.7 g/dL (ref 6.0–8.3)

## 2014-05-24 LAB — CBC
HCT: 42.9 % (ref 36.0–46.0)
Hemoglobin: 14 g/dL (ref 12.0–15.0)
MCH: 29.5 pg (ref 26.0–34.0)
MCHC: 32.6 g/dL (ref 30.0–36.0)
MCV: 90.5 fL (ref 78.0–100.0)
PLATELETS: 247 10*3/uL (ref 150–400)
RBC: 4.74 MIL/uL (ref 3.87–5.11)
RDW: 13.5 % (ref 11.5–15.5)
WBC: 14.8 10*3/uL — AB (ref 4.0–10.5)

## 2014-05-24 LAB — SURGICAL PCR SCREEN
MRSA, PCR: NEGATIVE
Staphylococcus aureus: NEGATIVE

## 2014-05-24 LAB — PROTIME-INR
INR: 1.08 (ref 0.00–1.49)
Prothrombin Time: 14 seconds (ref 11.6–15.2)

## 2014-05-24 SURGERY — REVISION, ARTHROPLASTY, HIP
Anesthesia: General | Site: Hip | Laterality: Right

## 2014-05-24 MED ORDER — LACTATED RINGERS IV SOLN
INTRAVENOUS | Status: DC | PRN
  Administered 2014-05-24 (×2): via INTRAVENOUS

## 2014-05-24 MED ORDER — ASPIRIN EC 325 MG PO TBEC
325.0000 mg | DELAYED_RELEASE_TABLET | Freq: Every day | ORAL | Status: DC
Start: 2014-05-25 — End: 2014-05-29
  Administered 2014-05-25 – 2014-05-29 (×5): 325 mg via ORAL
  Filled 2014-05-24 (×6): qty 1

## 2014-05-24 MED ORDER — BISACODYL 10 MG RE SUPP: 10.0000 mg | Freq: Every day | RECTAL | Status: DC | PRN

## 2014-05-24 MED ORDER — MORPHINE SULFATE 2 MG/ML IJ SOLN
INTRAMUSCULAR | Status: AC
  Filled 2014-05-24: qty 1

## 2014-05-24 MED ORDER — MIDAZOLAM HCL 2 MG/2ML IJ SOLN
INTRAMUSCULAR | Status: AC
  Filled 2014-05-24: qty 2

## 2014-05-24 MED ORDER — ROCURONIUM BROMIDE 100 MG/10ML IV SOLN
INTRAVENOUS | Status: DC | PRN
  Administered 2014-05-24: 35 mg via INTRAVENOUS

## 2014-05-24 MED ORDER — CHOLECALCIFEROL 10 MCG (400 UNIT) PO TABS
800.0000 [IU] | ORAL_TABLET | Freq: Every day | ORAL | Status: DC
  Administered 2014-05-24 – 2014-05-29 (×6): 800 [IU] via ORAL
  Filled 2014-05-24 (×6): qty 2

## 2014-05-24 MED ORDER — ACETAMINOPHEN 650 MG RE SUPP: 650.0000 mg | Freq: Four times a day (QID) | RECTAL | Status: DC | PRN

## 2014-05-24 MED ORDER — ONDANSETRON HCL 4 MG PO TABS: 4.0000 mg | ORAL_TABLET | Freq: Four times a day (QID) | ORAL | Status: DC | PRN

## 2014-05-24 MED ORDER — OXYCODONE HCL 5 MG PO TABS
5.0000 mg | ORAL_TABLET | ORAL | Status: DC | PRN
  Administered 2014-05-24 – 2014-05-26 (×3): 10 mg via ORAL
  Administered 2014-05-28: 5 mg via ORAL
  Administered 2014-05-29: 10 mg via ORAL
  Filled 2014-05-24 (×2): qty 2
  Filled 2014-05-24: qty 1
  Filled 2014-05-24: qty 2

## 2014-05-24 MED ORDER — ONDANSETRON HCL 4 MG/2ML IJ SOLN
INTRAMUSCULAR | Status: DC | PRN
  Administered 2014-05-24: 4 mg via INTRAVENOUS

## 2014-05-24 MED ORDER — OXYCODONE HCL 5 MG PO TABS
ORAL_TABLET | ORAL | Status: AC
  Filled 2014-05-24: qty 2

## 2014-05-24 MED ORDER — MENTHOL 3 MG MT LOZG: 1.0000 | LOZENGE | OROMUCOSAL | Status: DC | PRN

## 2014-05-24 MED ORDER — LIDOCAINE HCL (CARDIAC) 20 MG/ML IV SOLN
INTRAVENOUS | Status: AC
  Filled 2014-05-24: qty 5

## 2014-05-24 MED ORDER — CALCIUM CARBONATE 1250 (500 CA) MG PO TABS
1250.0000 mg | ORAL_TABLET | Freq: Every day | ORAL | Status: DC
  Administered 2014-05-25 – 2014-05-29 (×5): 1250 mg via ORAL
  Filled 2014-05-24 (×3): qty 1
  Filled 2014-05-24: qty 3
  Filled 2014-05-24: qty 1
  Filled 2014-05-24: qty 3
  Filled 2014-05-24: qty 1

## 2014-05-24 MED ORDER — ONDANSETRON HCL 4 MG/2ML IJ SOLN: 4.0000 mg | Freq: Four times a day (QID) | INTRAMUSCULAR | Status: DC | PRN

## 2014-05-24 MED ORDER — OXYCODONE HCL 5 MG PO TABS: 5.0000 mg | ORAL_TABLET | Freq: Once | ORAL | Status: DC | PRN

## 2014-05-24 MED ORDER — PHENYLEPHRINE HCL 10 MG/ML IJ SOLN
10.0000 mg | INTRAVENOUS | Status: DC | PRN
  Administered 2014-05-24: 20 ug/min via INTRAVENOUS

## 2014-05-24 MED ORDER — PHENYLEPHRINE HCL 10 MG/ML IJ SOLN
INTRAMUSCULAR | Status: DC | PRN
  Administered 2014-05-24 (×2): 80 ug via INTRAVENOUS
  Administered 2014-05-24: 40 ug via INTRAVENOUS
  Administered 2014-05-24 (×2): 80 ug via INTRAVENOUS
  Administered 2014-05-24: 40 ug via INTRAVENOUS

## 2014-05-24 MED ORDER — GLYCOPYRROLATE 0.2 MG/ML IJ SOLN
INTRAMUSCULAR | Status: AC
  Filled 2014-05-24: qty 2

## 2014-05-24 MED ORDER — CEFAZOLIN SODIUM-DEXTROSE 2-3 GM-% IV SOLR
INTRAVENOUS | Status: DC | PRN
  Administered 2014-05-24: 2 g via INTRAVENOUS

## 2014-05-24 MED ORDER — MORPHINE SULFATE 2 MG/ML IJ SOLN
1.0000 mg | INTRAMUSCULAR | Status: DC | PRN
  Administered 2014-05-24: 1 mg via INTRAVENOUS
  Filled 2014-05-24: qty 1

## 2014-05-24 MED ORDER — METOCLOPRAMIDE HCL 5 MG PO TABS: 5.0000 mg | ORAL_TABLET | Freq: Three times a day (TID) | ORAL | Status: DC | PRN

## 2014-05-24 MED ORDER — OXYCODONE-ACETAMINOPHEN 5-325 MG PO TABS
ORAL_TABLET | ORAL | Status: AC
  Filled 2014-05-24: qty 2

## 2014-05-24 MED ORDER — MUPIROCIN 2 % EX OINT
1.0000 "application " | TOPICAL_OINTMENT | Freq: Once | CUTANEOUS | Status: AC
  Administered 2014-05-24: 1 via TOPICAL

## 2014-05-24 MED ORDER — OXYCODONE HCL 5 MG/5ML PO SOLN: 5.0000 mg | Freq: Once | ORAL | Status: DC | PRN

## 2014-05-24 MED ORDER — OXYCODONE-ACETAMINOPHEN 5-325 MG PO TABS: 1.0000 | ORAL_TABLET | ORAL | Status: AC | PRN

## 2014-05-24 MED ORDER — DEXTROSE 5 % IV SOLN
500.0000 mg | Freq: Four times a day (QID) | INTRAVENOUS | Status: DC | PRN
  Filled 2014-05-24: qty 5

## 2014-05-24 MED ORDER — ADULT MULTIVITAMIN W/MINERALS CH
1.0000 | ORAL_TABLET | Freq: Every day | ORAL | Status: DC
  Administered 2014-05-24 – 2014-05-29 (×6): 1 via ORAL
  Filled 2014-05-24 (×6): qty 1

## 2014-05-24 MED ORDER — PROMETHAZINE HCL 25 MG/ML IJ SOLN: 6.2500 mg | INTRAMUSCULAR | Status: DC | PRN

## 2014-05-24 MED ORDER — KCL IN DEXTROSE-NACL 20-5-0.45 MEQ/L-%-% IV SOLN
INTRAVENOUS | Status: DC
  Administered 2014-05-25 – 2014-05-26 (×2): via INTRAVENOUS
  Administered 2014-05-27: 1 mL via INTRAVENOUS
  Filled 2014-05-24 (×13): qty 1000

## 2014-05-24 MED ORDER — PHENYLEPHRINE 40 MCG/ML (10ML) SYRINGE FOR IV PUSH (FOR BLOOD PRESSURE SUPPORT)
PREFILLED_SYRINGE | INTRAVENOUS | Status: AC
  Filled 2014-05-24: qty 10

## 2014-05-24 MED ORDER — PROPOFOL 10 MG/ML IV BOLUS
INTRAVENOUS | Status: DC | PRN
  Administered 2014-05-24: 100 mg via INTRAVENOUS

## 2014-05-24 MED ORDER — ACETAMINOPHEN 325 MG PO TABS: 650.0000 mg | ORAL_TABLET | Freq: Four times a day (QID) | ORAL | Status: DC | PRN

## 2014-05-24 MED ORDER — LACTATED RINGERS IV SOLN
INTRAVENOUS | Status: DC
  Administered 2014-05-24: 14:00:00 via INTRAVENOUS

## 2014-05-24 MED ORDER — CEFAZOLIN SODIUM-DEXTROSE 2-3 GM-% IV SOLR
INTRAVENOUS | Status: AC
  Filled 2014-05-24: qty 50

## 2014-05-24 MED ORDER — MORPHINE SULFATE 2 MG/ML IJ SOLN
1.0000 mg | INTRAMUSCULAR | Status: DC | PRN
  Administered 2014-05-24: 2 mg via INTRAVENOUS

## 2014-05-24 MED ORDER — POLYETHYLENE GLYCOL 3350 17 G PO PACK: 17.0000 g | PACK | Freq: Every day | ORAL | Status: DC | PRN

## 2014-05-24 MED ORDER — FLEET ENEMA 7-19 GM/118ML RE ENEM
1.0000 | ENEMA | Freq: Once | RECTAL | Status: AC | PRN
  Filled 2014-05-24: qty 1

## 2014-05-24 MED ORDER — PROPOFOL 10 MG/ML IV BOLUS
INTRAVENOUS | Status: AC
  Filled 2014-05-24: qty 20

## 2014-05-24 MED ORDER — NEOSTIGMINE METHYLSULFATE 10 MG/10ML IV SOLN
INTRAVENOUS | Status: DC | PRN
  Administered 2014-05-24: 3 mg via INTRAVENOUS

## 2014-05-24 MED ORDER — METOCLOPRAMIDE HCL 5 MG/ML IJ SOLN: 5.0000 mg | Freq: Three times a day (TID) | INTRAMUSCULAR | Status: DC | PRN

## 2014-05-24 MED ORDER — DOCUSATE SODIUM 100 MG PO CAPS
100.0000 mg | ORAL_CAPSULE | Freq: Two times a day (BID) | ORAL | Status: DC
  Administered 2014-05-24 – 2014-05-29 (×9): 100 mg via ORAL
  Filled 2014-05-24 (×8): qty 1

## 2014-05-24 MED ORDER — ROCURONIUM BROMIDE 50 MG/5ML IV SOLN
INTRAVENOUS | Status: AC
  Filled 2014-05-24: qty 1

## 2014-05-24 MED ORDER — ASPIRIN EC 325 MG PO TBEC: 325.0000 mg | DELAYED_RELEASE_TABLET | Freq: Every day | ORAL | Status: AC

## 2014-05-24 MED ORDER — LIDOCAINE HCL (CARDIAC) 20 MG/ML IV SOLN
INTRAVENOUS | Status: DC | PRN
  Administered 2014-05-24: 60 mg via INTRAVENOUS

## 2014-05-24 MED ORDER — SODIUM CHLORIDE 0.9 % IR SOLN
Status: DC | PRN
  Administered 2014-05-24: 200 mL

## 2014-05-24 MED ORDER — PHENOL 1.4 % MT LIQD: 1.0000 | OROMUCOSAL | Status: DC | PRN

## 2014-05-24 MED ORDER — MUPIROCIN 2 % EX OINT
TOPICAL_OINTMENT | CUTANEOUS | Status: AC
Start: 2014-05-24 — End: 2014-05-25
  Filled 2014-05-24: qty 22

## 2014-05-24 MED ORDER — GLYCOPYRROLATE 0.2 MG/ML IJ SOLN
INTRAMUSCULAR | Status: DC | PRN
  Administered 2014-05-24: .4 mg via INTRAVENOUS

## 2014-05-24 MED ORDER — METHOCARBAMOL 500 MG PO TABS
500.0000 mg | ORAL_TABLET | Freq: Four times a day (QID) | ORAL | Status: DC | PRN
  Filled 2014-05-24: qty 1

## 2014-05-24 MED ORDER — FENTANYL CITRATE 0.05 MG/ML IJ SOLN
INTRAMUSCULAR | Status: DC | PRN
  Administered 2014-05-24: 25 ug via INTRAVENOUS
  Administered 2014-05-24: 75 ug via INTRAVENOUS

## 2014-05-24 MED ORDER — ALBUMIN HUMAN 5 % IV SOLN
INTRAVENOUS | Status: DC | PRN
  Administered 2014-05-24 (×2): via INTRAVENOUS

## 2014-05-24 MED ORDER — FENTANYL CITRATE 0.05 MG/ML IJ SOLN
INTRAMUSCULAR | Status: AC
  Filled 2014-05-24: qty 5

## 2014-05-24 SURGICAL SUPPLY — 74 items
APL SKNCLS STERI-STRIP NONHPOA (GAUZE/BANDAGES/DRESSINGS) ×1
BENZOIN TINCTURE PRP APPL 2/3 (GAUZE/BANDAGES/DRESSINGS) ×3 IMPLANT
BLADE SAW SAG 73X25 THK (BLADE) ×2
BLADE SAW SGTL 73X25 THK (BLADE) ×1 IMPLANT
BLADE SURG ROTATE 9660 (MISCELLANEOUS) IMPLANT
BONE CEMENT GENTAMICIN (Cement) ×6 IMPLANT
BRUSH FEMORAL CANAL (MISCELLANEOUS) IMPLANT
CEMENT BONE GENTAMICIN 40 (Cement) IMPLANT
CLOSURE WOUND 1/2 X4 (GAUZE/BANDAGES/DRESSINGS) ×1
COVER BACK TABLE 24X17X13 BIG (DRAPES) ×3 IMPLANT
DRAPE INCISE IOBAN 66X45 STRL (DRAPES) IMPLANT
DRAPE ORTHO SPLIT 77X108 STRL (DRAPES) ×6
DRAPE SURG ORHT 6 SPLT 77X108 (DRAPES) ×2 IMPLANT
DRAPE U-SHAPE 47X51 STRL (DRAPES) ×3 IMPLANT
DRILL BIT 7/64X5 (BIT) ×3 IMPLANT
DRSG MEPILEX BORDER 4X4 (GAUZE/BANDAGES/DRESSINGS) ×2 IMPLANT
DRSG PAD ABDOMINAL 8X10 ST (GAUZE/BANDAGES/DRESSINGS) ×6 IMPLANT
DURAPREP 26ML APPLICATOR (WOUND CARE) ×3 IMPLANT
ELECT CAUTERY BLADE 6.4 (BLADE) ×3 IMPLANT
ELECT REM PT RETURN 9FT ADLT (ELECTROSURGICAL) ×3
ELECTRODE REM PT RTRN 9FT ADLT (ELECTROSURGICAL) ×1 IMPLANT
EVACUATOR 1/8 PVC DRAIN (DRAIN) IMPLANT
FACESHIELD WRAPAROUND (MASK) ×3 IMPLANT
FACESHIELD WRAPAROUND OR TEAM (MASK) ×2 IMPLANT
GAUZE SPONGE 4X4 12PLY STRL (GAUZE/BANDAGES/DRESSINGS) ×3 IMPLANT
GAUZE XEROFORM 5X9 LF (GAUZE/BANDAGES/DRESSINGS) ×1 IMPLANT
GLOVE BIOGEL PI IND STRL 7.5 (GLOVE) ×1 IMPLANT
GLOVE BIOGEL PI IND STRL 8 (GLOVE) ×1 IMPLANT
GLOVE BIOGEL PI INDICATOR 7.5 (GLOVE) ×2
GLOVE BIOGEL PI INDICATOR 8 (GLOVE) ×2
GLOVE ECLIPSE 7.0 STRL STRAW (GLOVE) ×3 IMPLANT
GLOVE ORTHO TXT STRL SZ7.5 (GLOVE) ×3 IMPLANT
GOWN STRL REUS W/ TWL LRG LVL3 (GOWN DISPOSABLE) ×1 IMPLANT
GOWN STRL REUS W/ TWL XL LVL3 (GOWN DISPOSABLE) ×1 IMPLANT
GOWN STRL REUS W/TWL LRG LVL3 (GOWN DISPOSABLE) ×3
GOWN STRL REUS W/TWL XL LVL3 (GOWN DISPOSABLE) ×3
HANDPIECE INTERPULSE COAX TIP (DISPOSABLE)
IMMOBILIZER KNEE 20 (SOFTGOODS) IMPLANT
IMMOBILIZER KNEE 22 UNIV (SOFTGOODS) ×2 IMPLANT
IMMOBILIZER KNEE 24 THIGH 36 (MISCELLANEOUS) IMPLANT
IMMOBILIZER KNEE 24 UNIV (MISCELLANEOUS)
KIT BASIN OR (CUSTOM PROCEDURE TRAY) ×3 IMPLANT
KIT ROOM TURNOVER OR (KITS) ×3 IMPLANT
MANIFOLD NEPTUNE II (INSTRUMENTS) ×3 IMPLANT
NDL 1/2 CIR MAYO (NEEDLE) ×1 IMPLANT
NEEDLE 1/2 CIR MAYO (NEEDLE) ×3 IMPLANT
NEEDLE HYPO 25GX1X1/2 BEV (NEEDLE) ×3 IMPLANT
NEEDLE MAYO .5 CIRCLE (NEEDLE) IMPLANT
NS IRRIG 1000ML POUR BTL (IV SOLUTION) ×3 IMPLANT
PACK TOTAL JOINT (CUSTOM PROCEDURE TRAY) ×3 IMPLANT
PAD ARMBOARD 7.5X6 YLW CONV (MISCELLANEOUS) ×6 IMPLANT
PIN STEINMAN 3/16 (PIN) ×3 IMPLANT
PRESSURIZER FEMORAL UNIV (MISCELLANEOUS) IMPLANT
RESTRICTOR CEMENT PE SZ 2 (Cement) ×2 IMPLANT
SET HNDPC FAN SPRY TIP SCT (DISPOSABLE) IMPLANT
SPONGE LAP 4X18 X RAY DECT (DISPOSABLE) ×6 IMPLANT
STAPLER VISISTAT 35W (STAPLE) ×3 IMPLANT
STEM CEMENTED SUMMIT SZ2 (Stem) ×2 IMPLANT
STRIP CLOSURE SKIN 1/2X4 (GAUZE/BANDAGES/DRESSINGS) ×2 IMPLANT
SUCTION FRAZIER TIP 10 FR DISP (SUCTIONS) ×3 IMPLANT
SUT ETHIBOND NAB CT1 #1 30IN (SUTURE) ×9 IMPLANT
SUT TICRON (SUTURE) ×6 IMPLANT
SUT VIC AB 0 CT1 27 (SUTURE)
SUT VIC AB 0 CT1 27XBRD ANBCTR (SUTURE) IMPLANT
SUT VIC AB 2-0 CT1 27 (SUTURE) ×6
SUT VIC AB 2-0 CT1 TAPERPNT 27 (SUTURE) ×2 IMPLANT
SUT VICRYL 0 TIES 12 18 (SUTURE) IMPLANT
SUT VICRYL 4-0 PS2 18IN ABS (SUTURE) ×3 IMPLANT
SYR CONTROL 10ML LL (SYRINGE) ×3 IMPLANT
TOWEL OR 17X24 6PK STRL BLUE (TOWEL DISPOSABLE) ×3 IMPLANT
TOWEL OR 17X26 10 PK STRL BLUE (TOWEL DISPOSABLE) ×3 IMPLANT
TOWER CARTRIDGE SMART MIX (DISPOSABLE) IMPLANT
TRAY FOLEY CATH 16FRSI W/METER (SET/KITS/TRAYS/PACK) IMPLANT
WATER STERILE IRR 1000ML POUR (IV SOLUTION) IMPLANT

## 2014-05-24 NOTE — Brief Op Note (Cosign Needed)
05/24/2014  5:18 PM  PATIENT:  Michele Johns  80 y.o. female  PRE-OPERATIVE DIAGNOSIS:  Infected Right Total Hip Arthroplasty  POST-OPERATIVE DIAGNOSIS:  Tumor Right Hip  PROCEDURE:  Procedure(s) with comments: Revision Femoral Stem Right Hip (Right) - Removal Right Total Hip Arthroplasty, Antibiotic Spacer  SURGEON:  Surgeon(s) and Role:    * Marybelle Killings, MD - Primary  PHYSICIAN ASSISTANT: [facility]  ASSISTANTS: none   ANESTHESIA:   general  EBL:  Total I/O In: 1500 [I.V.:1000; IV Piggyback:500] Out: 28 [Urine:110; Blood:500]  BLOOD ADMINISTERED:none  DRAINS: (1) Hemovact drain(s) in the right hip with  Suction Open   LOCAL MEDICATIONS USED:  NONE  SPECIMEN:  Source of Specimen:  hip fluid and tissue hip joint  DISPOSITION OF SPECIMEN:  PATHOLOGY  COUNTS:  YES  TOURNIQUET:  * No tourniquets in log *  DICTATION: .Dragon Dictation  PLAN OF CARE: Admit to inpatient   PATIENT DISPOSITION:  PACU - hemodynamically stable.   Delay start of Pharmacological VTE agent (>24hrs) due to surgical blood loss or risk of bleeding: yes

## 2014-05-24 NOTE — Interval H&P Note (Signed)
History and Physical Interval Note:  05/24/2014 2:48 PM  Tyishia Skluzacek  has presented today for surgery, with the diagnosis of Infected Right Total Hip Arthroplasty  The various methods of treatment have been discussed with the patient and family. After consideration of risks, benefits and other options for treatment, the patient has consented to  Procedure(s) with comments: TOTAL HIP ARTHROPLASTY WITH HARDWARE REMOVAL (Right) - Removal Right Total Hip Arthroplasty, Antibiotic Spacer as a surgical intervention .  The patient's history has been reviewed, patient examined, no change in status, stable for surgery.  I have reviewed the patient's chart and labs.  Questions were answered to the patient's satisfaction.     YATES,MARK C

## 2014-05-24 NOTE — Telephone Encounter (Signed)
Thanks, Santiago Glad.  Please let the nurse know we will hold off on SBRT until after her hip replacement. I will let sim know to take her off the schedule. Can you make an appointment to see Dr. Sondra Come in 3 weeks.  Thanks! SW

## 2014-05-24 NOTE — Progress Notes (Signed)
Phillips Hay PA-c notified of pt's placement to 6N d/t bed availability on 5N, and is ok with same.

## 2014-05-24 NOTE — Op Note (Signed)
Michele Johns, Michele Johns                ACCOUNT NO.:  [id number]  MEDICAL RECORD NO.:  [id number]  LOCATION:  6N19C                        FACILITY:  [facility]  PHYSICIAN:  Jimmy Stipes C. Lorin Mercy, M.D.    DATE OF BIRTH:  May 02, 1930  DATE OF PROCEDURE:  05/24/2014 DATE OF DISCHARGE:                              OPERATIVE REPORT   PREOPERATIVE DIAGNOSES:  Pathologic femur fracture, possible infection. Right total hip arthroplasty for femoral neck fracture.  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 PROCEDURE: right Total hip arthroplasty femoral stem revision with cemented stem revision SURGEON:  Ysabelle Goodroe C. Lorin Mercy, M.D.  ASSISTANT:  Phillips Hay, PA-C, medically necessary and present for the entire procedure.  EBL:  300-400 mL.  INDICATIONS:  This 80 year old female, former smoker, was initially seen in May with a femoral neck fracture.  She was taken to the operating room, underwent a press-fit total hip arthroplasty since she was active. Chest x-ray showed some abnormality.  Later worked up with CT and needle biopsy, PET scan, CT scan of the head and appears that she has bronchogenic cancer of the left lung.  She is scheduled for radiation. After the surgery, she returned to our office couple of weeks after surgery and had a fracture of the greater trochanter, then returned with increased pain and x-ray showed scalloping bone, destructive lesions, a crack in the subtrochanteric region with instability of the femoral stem with either severe infection or more likely metastatic tumor destruction of the proximal femur.  The patient's head had not been sent for pathologic exam since she had characteristic typical fracture of the femoral neck and no abnormal bone was noted at the time of surgery.  DESCRIPTION OF PROCEDURE:  After induction of general anesthesia, antibiotics were held for culture.  The patient was placed from lateral position, marked 7-French,  standard prepping and draping for total hip arthroplasty.  Posterior approach was performed.  Time-out procedure, Betadine, Steri-Drape x2 sealing the skin.  Posterior approach was made. Medially, there was soft tissue component stuck down posteriorly over the trochanter.  Greater trochanter was separate piece bony destruction. There was hypervascularity, Bovie was used very liberally, moving slowly and meticulously for arterial and venous bleeders, multiple pieces of tissue.  Inside the canal where we scooped out pieces of bone where they have been destruction and was sent for permanent path sections to Pathology.  There was no purulence.  Cultures were obtained, but there was no evidence of infection.  There was obvious destruction of bone consistent with metastatic disease and bone destruction.  Stem came out rather easily using just an osteotome slight next to the prosthesis. After the prosthesis was out, it was hard to find the canal distally. Some additional tumor was removed and then cement was mixed with vancomycin and placed down after cement restrictor.  #2 Summit basic stem was selected since it was longer than the [place] and was placed, cemented.  With the trochanter off, the medial cortex off the lesser trochanter off, the hip had to be reduced and estimate was made for appropriate length based on the opposite leg and appropriate anteversion for stability.  As cement was setting, it  was custom, molded up around the prosthesis proximally for support and x-ray was taken and trialed, but the cement was setting up to make sure that the stem was down the canal and length looked appropriate.  Once cement set up, the patient had flexion and extension.  There was stability of the hip.  She was only internally rotated about 70 degrees for fear of breaking of the femur.  Greater trochanter was so soft that any cable applied to it would not be able to be tightened down.  Copious  irrigation, Hemovac drain, then standard total hip arthroplasty closure with Ethibond in the fascia, 0 Vicryl in the gluteus maximus fascia, 2-0 Vicryl, skin staple closure, postop dressing, and transferred to the recovery room.  She will be seen by Radiation Oncology and can have the hip radiated as well as her chest starting next week.     Chinedu Agustin C. Lorin Mercy, M.D.     MCY/MEDQ  D:  05/24/2014  T:  05/24/2014  Job:  [id number]

## 2014-05-24 NOTE — Transfer of Care (Signed)
Immediate Anesthesia Transfer of Care Note  Patient: Michele Johns  Procedure(s) Performed: Procedure(s) with comments: Revision Femoral Stem Right Hip (Right) - Removal Right Total Hip Arthroplasty, Antibiotic Spacer  Patient Location: PACU  Anesthesia Type:General  Level of Consciousness: awake and alert   Airway & Oxygen Therapy: Patient Spontanous Breathing and Patient connected to nasal cannula oxygen  Post-op Assessment: Report given to PACU RN, Post -op Vital signs reviewed and stable and Patient moving all extremities X 4  Post vital signs: Reviewed and stable  Complications: No apparent anesthesia complications

## 2014-05-24 NOTE — Anesthesia Preprocedure Evaluation (Addendum)
Anesthesia Evaluation  Patient identified by MRN, date of birth, ID band Patient awake    Reviewed: Allergy & Precautions, H&P , NPO status , Patient's Chart, lab work & pertinent test results  Airway Mallampati: II TM Distance: >3 FB Neck ROM: Full    Dental no notable dental hx. (+) Edentulous Upper, Edentulous Lower   Pulmonary neg pulmonary ROS, COPDformer smoker,  breath sounds clear to auscultation  Pulmonary exam normal       Cardiovascular negative cardio ROS  Rhythm:Regular Rate:Normal     Neuro/Psych negative neurological ROS  negative psych ROS   GI/Hepatic negative GI ROS, Neg liver ROS,   Endo/Other  negative endocrine ROS  Renal/GU      Musculoskeletal negative musculoskeletal ROS (+)   Abdominal   Peds negative pediatric ROS (+)  Hematology negative hematology ROS (+)   Anesthesia Other Findings   Reproductive/Obstetrics negative OB ROS                          Anesthesia Physical Anesthesia Plan  ASA: III  Anesthesia Plan: General   Post-op Pain Management:    Induction: Intravenous  Airway Management Planned: Oral ETT  Additional Equipment:   Intra-op Plan:   Post-operative Plan: Extubation in OR  Informed Consent: I have reviewed the patients History and Physical, chart, labs and discussed the procedure including the risks, benefits and alternatives for the proposed anesthesia with the patient or authorized representative who has indicated his/her understanding and acceptance.   Dental advisory given  Plan Discussed with: CRNA and Surgeon  Anesthesia Plan Comments:        Anesthesia Quick Evaluation

## 2014-05-24 NOTE — Telephone Encounter (Signed)
Left a voice mail for [facility] or Dr. Lorin Mercy nurse to call back.

## 2014-05-24 NOTE — Progress Notes (Signed)
Orthopedic Tech Progress Note Patient Details:  Michele Johns Feb 23, 1930 [id number] Patient did not want said she felt she could not use. Patient ID: Michele Johns, female   DOB: 06-29-1930, 80 y.o.   MRN: [id number]   [name] 05/24/2014, 7:22 PM

## 2014-05-24 NOTE — Anesthesia Procedure Notes (Signed)
Procedure Name: Intubation Date/Time: 05/24/2014 3:48 PM Performed by: Blair Heys E Pre-anesthesia Checklist: Patient identified, Emergency Drugs available, Suction available and Patient being monitored Patient Re-evaluated:Patient Re-evaluated prior to inductionOxygen Delivery Method: Circle system utilized Preoxygenation: Pre-oxygenation with 100% oxygen Intubation Type: IV induction Ventilation: Mask ventilation without difficulty Laryngoscope Size: Miller and 2 Grade View: Grade I Tube type: Oral Tube size: 7.0 mm Number of attempts: 1 Airway Equipment and Method: Stylet Placement Confirmation: ETT inserted through vocal cords under direct vision,  positive ETCO2 and breath sounds checked- equal and bilateral Secured at: 21 cm Tube secured with: Tape Dental Injury: Teeth and Oropharynx as per pre-operative assessment

## 2014-05-24 NOTE — Interval H&P Note (Signed)
History and Physical Interval Note:  05/24/2014 2:35 PM  Michele Johns  has presented today for surgery, with the diagnosis of Infected Right Total Hip Arthroplasty  The various methods of treatment have been discussed with the patient and family. After consideration of risks, benefits and other options for treatment, the patient has consented to  Procedure(s) with comments: TOTAL HIP ARTHROPLASTY WITH HARDWARE REMOVAL (Right) - Removal Right Total Hip Arthroplasty, Antibiotic Spacer as a surgical intervention .  The patient's history has been reviewed, patient examined, no change in status, stable for surgery.  I have reviewed the patient's chart and labs.  Questions were answered to the patient's satisfaction.     YATES,MARK C

## 2014-05-25 ENCOUNTER — Telehealth: Payer: Self-pay | Admitting: Oncology

## 2014-05-25 ENCOUNTER — Encounter (HOSPITAL_COMMUNITY): Payer: Self-pay | Admitting: Orthopaedic Surgery

## 2014-05-25 LAB — BASIC METABOLIC PANEL
Anion gap: 9 (ref 5–15)
Anion gap: 9 (ref 5–15)
BUN: 15 mg/dL (ref 6–23)
BUN: 15 mg/dL (ref 6–23)
CHLORIDE: 104 meq/L (ref 96–112)
CO2: 27 mEq/L (ref 19–32)
CO2: 26 mEq/L (ref 19–32)
Calcium: 9.4 mg/dL (ref 8.4–10.5)
Calcium: 9.1 mg/dL (ref 8.4–10.5)
Chloride: 106 mEq/L (ref 96–112)
Creatinine, Ser: 0.61 mg/dL (ref 0.50–1.10)
Creatinine, Ser: 0.63 mg/dL (ref 0.50–1.10)
GFR calc Af Amer: >90 mL/min (ref >90)
GFR calc non Af Amer: 81 mL/min — ABNORMAL LOW (ref >90)
GFR, EST NON AFRICAN AMERICAN: 82 mL/min — AB (ref >90)
GLUCOSE: 135 mg/dL — AB (ref 70–99)
Glucose, Bld: 132 mg/dL — ABNORMAL HIGH (ref 70–99)
POTASSIUM: 4.4 meq/L (ref 3.7–5.3)
POTASSIUM: 4.6 meq/L (ref 3.7–5.3)
Sodium: 142 mEq/L (ref 137–147)
Sodium: 139 mEq/L (ref 137–147)

## 2014-05-25 LAB — CBC
HEMATOCRIT: 22.4 % — AB (ref 36.0–46.0)
HEMATOCRIT: 21.7 % — AB (ref 36.0–46.0)
HEMOGLOBIN: 7.1 g/dL — AB (ref 12.0–15.0)
Hemoglobin: 7.4 g/dL — ABNORMAL LOW (ref 12.0–15.0)
MCH: 29.2 pg (ref 26.0–34.0)
MCH: 29.2 pg (ref 26.0–34.0)
MCHC: 33 g/dL (ref 30.0–36.0)
MCHC: 32.7 g/dL (ref 30.0–36.0)
MCV: 88.5 fL (ref 78.0–100.0)
MCV: 89.3 fL (ref 78.0–100.0)
Platelets: 180 10*3/uL (ref 150–400)
Platelets: 180 10*3/uL (ref 150–400)
RBC: 2.53 MIL/uL — ABNORMAL LOW (ref 3.87–5.11)
RBC: 2.43 MIL/uL — AB (ref 3.87–5.11)
RDW: 13.7 % (ref 11.5–15.5)
RDW: 13.7 % (ref 11.5–15.5)
WBC: 13.1 10*3/uL — ABNORMAL HIGH (ref 4.0–10.5)
WBC: 13.8 10*3/uL — ABNORMAL HIGH (ref 4.0–10.5)

## 2014-05-25 LAB — PREPARE RBC (CROSSMATCH)

## 2014-05-25 MED ORDER — SODIUM CHLORIDE 0.9 % IV SOLN
Freq: Once | INTRAVENOUS | Status: AC
  Administered 2014-05-25: 11:00:00 via INTRAVENOUS

## 2014-05-25 MED ORDER — ENSURE COMPLETE PO LIQD
237.0000 mL | Freq: Two times a day (BID) | ORAL | Status: DC
  Administered 2014-05-26 – 2014-05-27 (×4): 237 mL via ORAL

## 2014-05-25 NOTE — Progress Notes (Signed)
Occupational Therapy Evaluation Patient Details Name: Michele Johns MRN: [id number] DOB: [birth year] Today's Date: 05/25/2014    History of Present Illness Michele Johns is an 80 y.o. Female who underwent THA on 03/01/14 with resulting pain and infection. Pt is now s/p THA removal of hardware secondary to infection on 05/24/14.    Clinical Impression   PTA pt lived at home and was independent since her THA on 03/01/14. Pt is determined to go home with her daughter, however feel that pt may need higher level of assistance. Pt was limited by pain and demonstrates decreased recall of her precautions, which impairs her safety with ADLs. Pt would benefit from skilled OT to address functional mobility and LB ADLs to increase independence.     Follow Up Recommendations  SNF;Supervision/Assistance - 24 hour    Equipment Recommendations  None recommended by OT       Precautions / Restrictions Precautions Precautions: Fall;Posterior Hip Precaution Booklet Issued: Yes (comment) Precaution Comments: Reviewed posterior hip precautions handout with pt.  Required Braces or Orthoses: Knee Immobilizer - Right Knee Immobilizer - Right: Other (comment) (when in bed) Restrictions Weight Bearing Restrictions: Yes RLE Weight Bearing: Partial weight bearing RLE Partial Weight Bearing Percentage or Pounds: 25-50      Mobility Bed Mobility Overal bed mobility: Needs Assistance Bed Mobility: Sit to Supine     Supine to sit: Mod assist;HOB elevated Sit to supine: Mod assist   General bed mobility comments: (A) for Bil LEs onto bed and truncal support to due guarded movement from pain. VC's for hip precautions and positioniong of RLE on bed.   Transfers Overall transfer level: Needs assistance Equipment used: Rolling Sobel (2 wheeled) Transfers: Sit to/from [facility] Sit to Stand: Min assist Stand pivot transfers: Min assist       General transfer comment: cues for hip precautions  and safety with RW; min (A) to balance and manage RW; cues for PWB status throughout     Balance Overall balance assessment: Needs assistance Sitting-balance support: Feet supported;Bilateral upper extremity supported Sitting balance-Leahy Scale: Poor Sitting balance - Comments: pt leaning heavily to Lt secondary to incr pain with WB on Rt hip Postural control: Left lateral lean Standing balance support: During functional activity;Bilateral upper extremity supported Standing balance-Leahy Scale: Poor Standing balance comment: pt relies heavily on RW for UE support and min (A) for balance due to PWB status                             ADL Overall ADL's : Needs assistance/impaired Eating/Feeding: Independent;Sitting   Grooming: Set up;Sitting   Upper Body Bathing: Set up;Sitting   Lower Body Bathing: Moderate assistance;Sit to/from stand;+2 for safety/equipment   Upper Body Dressing : Set up;Sitting   Lower Body Dressing: Sit to/from stand;Maximal assistance;+2 for safety/equipment   Toilet Transfer: Minimal assistance;+2 for safety/equipment;Stand-pivot;BSC;RW   Toileting- Clothing Manipulation and Hygiene: Maximal assistance;+2 for safety/equipment;Sit to/from stand   Tub/ Banker: Total assistance   Functional mobility during ADLs: Minimal assistance;+2 for safety/equipment;Rolling Shonka General ADL Comments: Pt is anxious about falling and guarded due to pain in surgical hip. Pt requires VC's for maintaining WB and posterior hip precautions as well as physical assist for RLE.  Pt completed transfer from recliner > BSC > bed.      Vision  No apparent visual deficits.  Perception Perception Perception Tested?: No   Praxis Praxis Praxis tested?: Within functional limits    Pertinent Vitals/Pain Pain Assessment: 0-10 Pain Score: 10-Worst pain ever (with activity; at rest 2/10) Pain Location: posterior hip (surgical  site) Pain Descriptors / Indicators: Operative site guarding;Sharp;Shooting Pain Intervention(s): Limited activity within patient's tolerance;Repositioned;Monitored during session     Hand Dominance Right   Extremity/Trunk Assessment Upper Extremity Assessment Upper Extremity Assessment: Generalized weakness   Lower Extremity Assessment Lower Extremity Assessment: Defer to PT evaluation RLE: Unable to fully assess due to pain RLE Sensation:  (WFL to light touch) RLE Coordination: decreased gross motor;decreased fine motor   Cervical / Trunk Assessment Cervical / Trunk Assessment: Normal   Communication Communication Communication: No difficulties   Cognition Arousal/Alertness: Awake/alert Behavior During Therapy: Anxious Overall Cognitive Status: No family/caregiver present to determine baseline cognitive functioning Area of Impairment: Safety/judgement;Problem solving     Memory: Decreased recall of precautions;Decreased short-term memory   Safety/Judgement: Decreased awareness of safety;Decreased awareness of deficits   Problem Solving: Slow processing;Requires verbal cues;Requires tactile cues;Difficulty sequencing General Comments: pt with difficulty throughout session recalling and applying precautions               Home Living Family/patient expects to be discharged to:: Private residence Living Arrangements: Children;Other relatives Available Help at Discharge: Family;Available 24 hours/day Type of Home: House Home Access: Level entry     Home Layout: One level     Bathroom Shower/Tub: Walk-in shower Shower/tub characteristics: Curtain Bathroom Toilet: Handicapped height     Home Equipment: Environmental consultant - 2 wheels;Bedside commode;Shower seat   Additional Comments: pt plans to D/C home with daughter after acute D/C; has walk in shower      Prior Functioning/Environment Level of Independence: Independent        Comments: pt had returned to living  independently after surgery in may of this year    OT Diagnosis: Generalized weakness;Acute pain   OT Problem List: Decreased strength;Decreased range of motion;Decreased activity tolerance;Impaired balance (sitting and/or standing);Decreased safety awareness;Decreased knowledge of use of DME or AE;Decreased knowledge of precautions;Pain   OT Treatment/Interventions: Self-care/ADL training;Therapeutic exercise;Energy conservation;DME and/or AE instruction;Therapeutic activities;Patient/family education;Balance training    OT Goals(Current goals can be found in the care plan section) Acute Rehab OT Goals Patient Stated Goal: to go home with my daughter OT Goal Formulation: With patient Time For Goal Achievement: 06/01/14 Potential to Achieve Goals: Good ADL Goals Pt Will Perform Lower Body Bathing: with min assist;sit to/from stand Pt Will Perform Lower Body Dressing: with min assist;sit to/from stand Pt Will Transfer to Toilet: with supervision;stand pivot transfer;bedside commode Pt Will Perform Toileting - Clothing Manipulation and hygiene: with supervision;sit to/from stand Pt Will Perform Tub/Shower Transfer: with supervision;Stand pivot transfer;rolling Kydd Additional ADL Goal #1: Pt will perform bed mobility with min guard assist to prepare for ADLs.   OT Frequency: Min 2X/week    End of Session Equipment Utilized During Treatment: Gait belt;Rolling Sedler;Right knee immobilizer Nurse Communication: Mobility status  Activity Tolerance: Patient limited by pain Patient left: in bed;with call bell/phone within reach   Time: 1610-1633 OT Time Calculation (min): 23 min Charges:  OT General Charges $OT Visit: 1 Procedure OT Evaluation $Initial OT Evaluation Tier I: 1 Procedure OT Treatments $Self Care/Home Management : 8-22 mins  Juluis Rainier [phone] 05/25/2014, 4:56 PM

## 2014-05-25 NOTE — Progress Notes (Addendum)
   Subjective:  Patient reports pain as moderate.  No events  Objective:   VITALS:   Filed Vitals:   05/24/14 2000 05/24/14 2219 05/25/14 0158 05/25/14 0603  BP:  115/34 117/30 127/31  Pulse:  88 93 89  Temp:  99 F (37.2 C) 99.4 F (37.4 C) 98.9 F (37.2 C)  TempSrc:  Oral Oral Oral  Resp: 18 20 20 20   Height:      Weight:      SpO2: 99% 100% 98% 100%    Dressings c/d/i +foot drop 2+ pulses HVAC with good suction   Lab Results  Component Value Date   WBC 13.1* 05/25/2014   HGB 7.4* 05/25/2014   HCT 22.4* 05/25/2014   MCV 88.5 05/25/2014   PLT 180 05/25/2014     Assessment/Plan:  1 Day Post-Op   - Expected postop acute blood loss anemia - will monitor for symptoms - Up with PT/OT - DVT ppx - SCDs, ambulation, asa - WBAT right lower extremity - foot drop - flex knee, medaboot, will follow exam - HVAC removed - Pain control - 1 unit RBCs ordered - Discharge planning  Marianna Payment 05/25/2014, 7:29 AM [phone]

## 2014-05-25 NOTE — Anesthesia Postprocedure Evaluation (Signed)
  Anesthesia Post-op Note  Patient: Michele Johns  Procedure(s) Performed: Procedure(s) with comments: Revision Femoral Stem Right Hip (Right) - Removal Right Total Hip Arthroplasty, Antibiotic Spacer  Patient Location: PACU  Anesthesia Type:General  Level of Consciousness: awake and alert   Airway and Oxygen Therapy: Patient Spontanous Breathing  Post-op Pain: mild  Post-op Assessment: Post-op Vital signs reviewed  Post-op Vital Signs: stable  Last Vitals:  Filed Vitals:   05/25/14 0954  BP: 114/30  Pulse:   Temp:   Resp:     Complications: No apparent anesthesia complications

## 2014-05-25 NOTE — Telephone Encounter (Signed)
Called Diella's nurse, Roque Lias, RN.  Advised her that Ortha will not have radiation next week due to her surgery.  She has an appointment scheduled on 06/21/14 to see Dr. Sondra Come.  Roque Lias said that she will let Aby know.

## 2014-05-25 NOTE — Progress Notes (Signed)
Orthopedic Tech Progress Note Patient Details:  Billee Balcerzak 1930-07-10 [id number] Brace completed by bio-tech vendor. Patient ID: Angelea Penny, female   DOB: 07-25-1930, 80 y.o.   MRN: [id number]   [name] 05/25/2014, 8:26 PM

## 2014-05-25 NOTE — Progress Notes (Signed)
PT Cancellation Note  Patient Details Name: Michele Johns MRN: [id number] DOB: Jun 01, 1930   Cancelled Treatment:     Pt Hgb at 7.0 this morning. receiving blood at this time. Will re-check back with pt this afternoon for PT evaluation.    Melina Modena [place], [place] 05/25/2014, 2:08 PM

## 2014-05-25 NOTE — Progress Notes (Signed)
INITIAL NUTRITION ASSESSMENT  DOCUMENTATION CODES Per approved criteria  -Severe malnutrition in the context of chronic illness   INTERVENTION: -Ensure Complete BID, each provides 350 kcal, 13 g protein -Encourage adequate intake of meals.  -Advance diet as tolerated to Regular.  NUTRITION DIAGNOSIS: Inadequate oral intake related to decreased appetite as evidenced by 25% intake of full liquids.   Goal: Patient will meet >/=90% of estimated nutrition needs  Monitor:  PO intake and diet advancement, weight, labs, I/Os  Reason for Assessment: Malnutrition screening tool  80 y.o. female  Admitting Dx: Pathological fracture of hip in neoplastic disease  ASSESSMENT: 80 year old female with history of COPD and non-small cell lung cancer. She is s/p right total hip arthroplasty 03/01/2014. Returns with increase in hip pain.   Patient is s/p hardware removal for infected hip arthroplasty.   She is currently on a full liquid diet with 25% intake. She reports a fair appetite since surgery in May. She has lost about 10 pounds (10% weight loss) over that time, which is significant for the time frame. Her appetite has not improved, and she does not feel up to eating solid foods. However, she is open to nutrition supplements.   Nutrition Focused Physical Exam:  Subcutaneous Fat:  Orbital Region: severe depletion Upper Arm Region: severe depletion Thoracic and Lumbar Region: severe depletion  Muscle:  Temple Region: severe depletion Clavicle Bone Region: severe depletion Clavicle and Acromion Bone Region: severe depletion Scapular Bone Region: severe depletion Dorsal Hand: severe depletion Patellar Region: severe depletion Anterior Thigh Region: severe depletion Posterior Calf Region: severe depletion  Edema: not present  Height: Ht Readings from Last 1 Encounters:  05/24/14 5\' 2"  (1.575 m)    Weight: Wt Readings from Last 1 Encounters:  05/24/14 88 lb (39.917 kg)     Ideal Body Weight: 110 pounds  % Ideal Body Weight: 80%  Wt Readings from Last 10 Encounters:  05/24/14 88 lb (39.917 kg)  05/24/14 88 lb (39.917 kg)  05/10/14 88 lb 11.2 oz (40.234 kg)  05/04/14 88 lb (39.917 kg)  04/27/14 88 lb 11.2 oz (40.234 kg)  04/20/14 90 lb 9.6 oz (41.096 kg)  04/18/14 91 lb (41.277 kg)  03/29/14 93 lb 6.4 oz (42.366 kg)  03/08/14 119 lb 14.9 oz (54.4 kg)  03/08/14 119 lb 14.9 oz (54.4 kg)    Usual Body Weight: 98 pounds  % Usual Body Weight: 90%  BMI:  Body mass index is 16.09 kg/(m^2). Patient is underweight.   Estimated Nutritional Needs: Kcal: 1350-1450 kcal Protein: 55-65 g Fluid: >1.2 L/day  Skin: incision right hip  Diet Order: General  EDUCATION NEEDS: -No education needs identified at this time   Intake/Output Summary (Last 24 hours) at 05/25/14 1413 Last data filed at 05/25/14 1253  Gross per 24 hour  Intake 2052.5 ml  Output   1485 ml  Net  567.5 ml    Last BM: PTA   Labs:   Recent Labs Lab 05/24/14 1349 05/25/14 0525 05/25/14 0825  NA 145 142 139  K 5.1 4.4 4.6  CL 103 106 104  CO2 28 27 26   BUN 14 15 15   CREATININE 0.64 0.61 0.63  CALCIUM 11.3* 9.4 9.1  GLUCOSE 129* 132* 135*    CBG (last 3)  No results found for this basename: GLUCAP,  in the last 72 hours  Scheduled Meds: . aspirin EC  325 mg Oral Q breakfast  . calcium carbonate  1,250 mg Oral Q breakfast  .  cholecalciferol  800 Units Oral Daily  . docusate sodium  100 mg Oral BID  . multivitamin with minerals  1 tablet Oral Daily    Continuous Infusions: . dextrose 5 % and 0.45 % NaCl with KCl 20 mEq/L      Past Medical History  Diagnosis Date  . Lung mass   . COPD (chronic obstructive pulmonary disease)   . Osteoporosis   . Hypoxemia   . Anemia   . History of blood transfusion 02/2014    "right after hip OR"  . Lung cancer, upper lobe     left lung    Past Surgical History  Procedure Laterality Date  . Total hip arthroplasty  Right 03/01/2014    Procedure: TOTAL HIP ARTHROPLASTY ANTERIOR APPROACH;  Surgeon: Marybelle Killings, MD;  Location: [facility];  Service: Orthopedics;  Laterality: Right;  . Cholecystectomy  1996  . Endobronchial ultrasound Bilateral 04/10/2014    Procedure: ENDOBRONCHIAL ULTRASOUND;  Surgeon: Rigoberto Noel, MD;  Location: [facility];  Service: Cardiopulmonary;  Laterality: Bilateral;  . Lung biopsy Left 04/18/14    CT Guided  . Cataract extraction w/ intraocular lens  implant, bilateral Bilateral   . Revision total hip arthroplasty Right 05/24/2014    w/spacer insertion & hardware removal  . Tonsillectomy  ~ 1945  . Total hip arthroplasty with hardware removal Right 05/24/2014    Procedure: Revision Femoral Stem Right Hip;  Surgeon: Marybelle Killings, MD;  Location: [facility];  Service: Orthopedics;  Laterality: Right;  Removal Right Total Hip Arthroplasty, Antibiotic Spacer    Larey Seat, RD, LDN Pager #: [phone] After-Hours Pager #: [phone]

## 2014-05-25 NOTE — Evaluation (Signed)
Physical Therapy Evaluation Patient Details Name: Michele Johns MRN: [id number] DOB: November 30, 1929 Today's Date: 05/25/2014   History of Present Illness  80 year old female returns.  She states she has had considerable increase in her hip pain, has been present for the last few days.  She has had pain and noticed some pain that radiates down to her knee, limited use of her legs. She had a femoral neck fracture, underwent total hip arthroplasty 03/01/2014.  Pt s/p TOTAL HIP ARTHROPLASTY WITH HARDWARE REMOVAL TOTAL HIP ARTHROPLASTY WITH HARDWARE REMOVAL secondary to infection.   Clinical Impression  Pt is s/p Rt THA with hardware removal POD#1 resulting in the deficits listed below (see PT Problem List).  Pt will benefit from skilled PT to increase their independence and safety with mobility to allow discharge to the venue listed below. Pt adamant that she plans to D/C home with daughter. Pt was limited to SPT to chair only during evaluation due to pain. Will cont to assess need for SNF vs HHPT as therapy progresses.     Follow Up Recommendations Home health PT;Supervision/Assistance - 24 hour (pending progress, may need SNF)     Equipment Recommendations  Wheelchair (measurements PT);Wheelchair cushion (measurements PT);Other (comment) (for long distances )    Recommendations for Other Services OT consult     Precautions / Restrictions Precautions Precautions: Fall;Posterior Hip Precaution Booklet Issued: Yes (comment) Precaution Comments: pt given posterior hip precautions handout Required Braces or Orthoses: Knee Immobilizer - Right Knee Immobilizer - Right: Other (comment) (when in bed) Restrictions Weight Bearing Restrictions: Yes RLE Weight Bearing: Partial weight bearing RLE Partial Weight Bearing Percentage or Pounds: 25-50      Mobility  Bed Mobility Overal bed mobility: Needs Assistance Bed Mobility: Supine to Sit     Supine to sit: Mod assist;HOB elevated     General bed  mobility comments: pt greatly limited by pain; mod (A) to advance Rt LE to/off EOB and bring trunk to sitting position; max cues throughout for hip precautions   Transfers Overall transfer level: Needs assistance Equipment used: Rolling [place] (2 wheeled) Transfers: Sit to/from [facility] Sit to Stand: Min assist Stand pivot transfers: Min assist       General transfer comment: cues for hip precautions and safety with RW; min (A) to balance and manage RW; cues for PWB status throughout   Ambulation/Gait             General Gait Details: pivotal steps only due to pain  Stairs            Wheelchair Mobility    Modified Rankin (Stroke Patients Only)       Balance Overall balance assessment: Needs assistance Sitting-balance support: Feet supported;Bilateral upper extremity supported Sitting balance-Leahy Scale: Poor Sitting balance - Comments: pt leaning heavily to Lt secondary to incr pain with WB on Rt hip Postural control: Left lateral lean Standing balance support: During functional activity;Bilateral upper extremity supported Standing balance-Leahy Scale: Poor Standing balance comment: pt relies heavily on RW for UE support and min (A) for balance due to [facility] status                              Pertinent Vitals/Pain Pain Assessment: 0-10 Pain Score: 10-Worst pain ever (with activity; at rest 2/10) Pain Location: poster hip Pain Descriptors / Indicators: Operative site guarding;Shooting;Sharp Pain Intervention(s): Premedicated before session;Repositioned;Monitored during session    Home Living Family/patient expects to be  discharged to:: Private residence Living Arrangements: Children;Other relatives Available Help at Discharge: Family;Available 24 hours/day Type of Home: House Home Access: Level entry     Home Layout: One level Home Equipment: [place] - 2 wheels;Bedside commode;Shower seat Additional Comments: pt plans to D/C  home with daughter after acute D/C; has walk in shower    Prior Function Level of Independence: Independent         Comments: pt had returned to living independently after surgery in may of this year     Hand Dominance   Dominant Hand: Right    Extremity/Trunk Assessment   Upper Extremity Assessment: Defer to OT evaluation           Lower Extremity Assessment: RLE deficits/detail      Cervical / Trunk Assessment: Normal  Communication   Communication: No difficulties  Cognition Arousal/Alertness: Awake/alert Behavior During Therapy: Anxious Overall Cognitive Status: No family/caregiver present to determine baseline cognitive functioning Area of Impairment: Safety/judgement;Problem solving     Memory: Decreased recall of precautions;Decreased short-term memory   Safety/Judgement: Decreased awareness of safety;Decreased awareness of deficits   Problem Solving: Slow processing;Requires verbal cues;Requires tactile cues;Difficulty sequencing General Comments: pt with difficulty throughout session recalling and applying precautions     General Comments      Exercises Total Joint Exercises Ankle Circles/Pumps: AROM;Both;10 reps;Seated Heel Slides: AAROM;Right;5 reps;Seated (limited due to pain) Hip ABduction/ADduction: AROM;Right;5 reps;Seated;Other (comment) (limited due to pain)      Assessment/Plan    PT Assessment Patient needs continued PT services  PT Diagnosis Difficulty walking;Generalized weakness;Acute pain   PT Problem List Decreased range of motion;Decreased strength;Decreased activity tolerance;Decreased balance;Decreased mobility;Decreased cognition;Decreased knowledge of use of DME;Decreased safety awareness;Decreased knowledge of precautions;Pain  PT Treatment Interventions DME instruction;Gait training;Functional mobility training;Therapeutic activities;Therapeutic exercise;Balance training;Neuromuscular re-education;Patient/family  education;Wheelchair mobility training   PT Goals (Current goals can be found in the Care Plan section) Acute Rehab PT Goals Patient Stated Goal: to go home with my daughter like i did the last time PT Goal Formulation: With patient Time For Goal Achievement: 06/01/14 Potential to Achieve Goals: Good    Frequency Min 4X/week   Barriers to discharge        Co-evaluation               End of Session Equipment Utilized During Treatment: Gait belt Activity Tolerance: Patient limited by pain Patient left: in chair;with call bell/phone within reach Nurse Communication: Mobility status;Precautions;Weight bearing status         Time: 6294-7654 PT Time Calculation (min): 22 min   Charges:   PT Evaluation $Initial PT Evaluation Tier I: 1 Procedure PT Treatments $Therapeutic Activity: 8-22 mins   PT G CodesGustavus Bryant, [place]  [phone] 05/25/2014, 3:30 PM

## 2014-05-25 NOTE — Telephone Encounter (Signed)
Called Dr. Lorin Mercy' office and advised that Michele Johns will not start radiation to her chest next week due to her hip surgery.  She will be seen on 06/21/14 by Dr. Sondra Come.

## 2014-05-25 NOTE — Social Work (Signed)
Clinical Social Work Department BRIEF PSYCHOSOCIAL ASSESSMENT 05/25/2014  Patient:  [facility]     Account Number:  [id number]     Admit date:  05/24/2014  Clinical Social Worker:  Jennette Dubin  Date/Time:  05/25/2014 11:10 AM  Referred by:  RN  Date Referred:  05/25/2014 Referred for  SNF Placement vs Home   Other Referral:  N/A Interview type:  Other - See comment Other interview type:  N/A  PSYCHOSOCIAL DATA Living Status:  OTHER RELATIVE Admitted from facility:  N/A Level of care:  N/A Primary support name:  Winfield Cunas Primary support relationship to patient:  daughter Degree of support available:  Good  CURRENT CONCERNS  Other Concerns:    SOCIAL WORK ASSESSMENT / PLAN SW spoke with pt in regards to her plan for discharge. Pt reported that upon discharge that she will go to stay with her daughter, Olin Hauser. Pt is very concerned about medical bills and wanted to know how much her insurance would actually pay. SW gave pt phone number to billing office to inquire. Pt has no further questions of SW.    Kathreen Cosier [phone] Assessment/plan status:  No Further Intervention Required Other assessment/ plan:   Information/referral to community resources:    PATIENT'S/FAMILY'S RESPONSE TO PLAN OF CARE: Pt awaiting disposition. Pt appreciative of SW visit.   Adelina Mings [phone]

## 2014-05-26 LAB — CBC
HEMATOCRIT: 25.6 % — AB (ref 36.0–46.0)
Hemoglobin: 8.5 g/dL — ABNORMAL LOW (ref 12.0–15.0)
MCH: 29.8 pg (ref 26.0–34.0)
MCHC: 33.2 g/dL (ref 30.0–36.0)
MCV: 89.8 fL (ref 78.0–100.0)
Platelets: 181 10*3/uL (ref 150–400)
RBC: 2.85 MIL/uL — ABNORMAL LOW (ref 3.87–5.11)
RDW: 13.7 % (ref 11.5–15.5)
WBC: 14 10*3/uL — ABNORMAL HIGH (ref 4.0–10.5)

## 2014-05-26 LAB — PREPARE RBC (CROSSMATCH)

## 2014-05-26 MED ORDER — SODIUM CHLORIDE 0.9 % IV SOLN
Freq: Once | INTRAVENOUS | Status: AC
  Administered 2014-05-26: 09:00:00 via INTRAVENOUS

## 2014-05-26 MED ORDER — FUROSEMIDE 10 MG/ML IJ SOLN
20.0000 mg | Freq: Once | INTRAMUSCULAR | Status: AC
  Administered 2014-05-26: 20 mg via INTRAVENOUS
  Filled 2014-05-26: qty 2

## 2014-05-26 MED ORDER — FUROSEMIDE 10 MG/ML IJ SOLN
INTRAMUSCULAR | Status: AC
  Filled 2014-05-26: qty 4

## 2014-05-26 NOTE — Clinical Documentation Improvement (Signed)
Possible Clinical Conditions? INITIAL NUTRITION ASSESSMENT  05/25/2014    Severe Malnutrition   Protein Calorie Malnutrition Severe Protein Calorie Malnutrition Emaciation  Cachexia    Other Condition Cannot clinically determine  Supporting Information: Risk Factors: Signs & Symptoms:Subcutaneous Fat:  Orbital Region: severe depletion  Upper Arm Region: severe depletion  Thoracic and Lumbar Region: severe depletion Muscle: severe depletion   Diagnostics:Height: 5'2"   Wt: 88 lbs  BMI:16.09 kg  Treatment:Ensure Complete BID, each provides 350 kcal, 13 g protein  -Encourage adequate intake of meals.  -Advance diet as tolerated to Regular.   Thank You, Philippa Chester ,RN Clinical Documentation Specialist:  [place] [employer]

## 2014-05-26 NOTE — Progress Notes (Signed)
Spoke with pt and daughter in the room about plans for discharge. PT has worked with patient and determined that SNF is the safest plan for discharge at this time.  Pt states that she isn't thrilled with the idea but accepts that it is the safest option now. Process of finding a SNF explained to the patient and she gave permission to do the SNF search.  Stated she was at [facility] in the past and would be willing to go back there if they had a place for her. CSW notified of SNF search permission given from pt.

## 2014-05-26 NOTE — Progress Notes (Signed)
Physical Therapy Treatment Patient Details Name: Michele Johns MRN: [id number] DOB: 06-17-1930 Today's Date: 05/26/2014    History of Present Illness Michele Johns is an 80 y.o. Female who underwent THA on 03/01/14 with resulting pain and infection. Pt is now s/p THA removal of hardware secondary to infection on 05/24/14.     PT Comments    Pt able to progress mobility minimally this session. With fatigue required mod (A) to maintain balance and manage RW. Daughter, caregiver, present during session and we had lengthy discussion regarding D/C disposition. At this time, due to incr (A) and precautions of posterior hip replacement; recommendation for SNF for post acute rehab. Pt and daughter agreeable to SNF and hopeful for [facility].   Follow Up Recommendations  SNF;Supervision/Assistance - 24 hour     Equipment Recommendations  Other (comment) (TBD at SNF)    Recommendations for Other Services       Precautions / Restrictions Precautions Precautions: Fall;Posterior Hip Precaution Comments: pt able to recall 2/3 hip precautions; re-educated pt on hip precautions with daughter present  Required Braces or Orthoses: Knee Immobilizer - Right Knee Immobilizer - Right:  (in bed) Restrictions Weight Bearing Restrictions: Yes RLE Weight Bearing: Partial weight bearing RLE Partial Weight Bearing Percentage or Pounds: 25-50    Mobility  Bed Mobility Overal bed mobility: Needs Assistance Bed Mobility: Supine to Sit;Sit to Supine     Supine to sit: Min assist;HOB elevated Sit to supine: Min assist   General bed mobility comments: (A) to advance Rt LE and prevent IR at all times; cues for sequencing and hand placement  Transfers Overall transfer level: Needs assistance Equipment used: Rolling [place] (2 wheeled) Transfers: Stand Pivot Transfers;Sit to/from Stand Sit to Stand: Min assist Stand pivot transfers: Min assist       General transfer comment: initially performed SPT bed to  [facility] for urgent use of commode; cues for hand placement and safety with RW and to adhere to posterior hip precautions   Ambulation/Gait Ambulation/Gait assistance: Min assist;Mod assist Ambulation Distance (Feet): 14 Feet Assistive device: Rolling [place] (2 wheeled) Gait Pattern/deviations: Step-to pattern;Decreased stance time - right;Decreased stance time - left;Shuffle;Narrow base of support;Trunk flexed (decr hip flexion and DF of Rt Le ) Gait velocity: decr due to pain Gait velocity interpretation: Below normal speed for age/gender General Gait Details: pt initially min (A) with mobility; with incr fatigue pt begins to ambulate with increased trunk flexion and has difficulty clearing Rt LE; (A) to manage RW and maintain balance at all times    Stairs            Wheelchair Mobility    Modified Rankin (Stroke Patients Only)       Balance Overall balance assessment: Needs assistance Sitting-balance support: Feet supported;No upper extremity supported Sitting balance-Leahy Scale: Fair     Standing balance support: During functional activity;Bilateral upper extremity supported Standing balance-Leahy Scale: Poor Standing balance comment: required UE support at all times; tolerated standing at sink ~2 min while performing hand hygiene              High level balance activites: Direction changes;Backward walking High Level Balance Comments: required cues for sequencing and (A) to manage RW and maintain balance    Cognition Arousal/Alertness: Awake/alert Behavior During Therapy: Anxious Overall Cognitive Status: Within Functional Limits for tasks assessed       Memory: Decreased recall of precautions              Exercises Total Joint Exercises  Ankle Circles/Pumps: AROM;Both;10 reps;Supine Long Arc Quad: AROM;Right;10 reps;Seated    General Comments General comments (skin integrity, edema, etc.): daughter present during session today; lengthy discussion  regarding D/C disposition      Pertinent Vitals/Pain Pain Assessment: 0-10 Pain Score: 8  Pain Location: surgical site Pain Descriptors / Indicators: Operative site guarding;Sharp;Shooting Pain Intervention(s): Premedicated before session;Repositioned    Home Living                      Prior Function            PT Goals (current goals can now be found in the care plan section) Acute Rehab PT Goals Patient Stated Goal: to get moving better and then back home PT Goal Formulation: With patient Time For Goal Achievement: 06/01/14 Potential to Achieve Goals: Good Progress towards PT goals: Progressing toward goals    Frequency  Min 4X/week    PT Plan Discharge plan needs to be updated    Co-evaluation             End of Session Equipment Utilized During Treatment: Gait belt Activity Tolerance: Patient limited by fatigue Patient left: in bed;with call bell/phone within reach;with family/visitor present     Time: 1430-1455 PT Time Calculation (min): 25 min  Charges:  $Gait Training: 8-22 mins $Therapeutic Activity: 8-22 mins                    G CodesGustavus Bryant, [place] 05/26/2014, 3:06 PM

## 2014-05-26 NOTE — Progress Notes (Signed)
Physical Therapy Treatment Patient Details Name: Michele Johns MRN: [id number] DOB: October 11, 1930 Today's Date: 05/26/2014    History of Present Illness Michele Johns is an 80 y.o. Female who underwent THA on 03/01/14 with resulting pain and infection. Pt is now s/p THA removal of hardware secondary to infection on 05/24/14.     PT Comments    Pt able to progress mobility today. Continues to require cues throughout session for posterior hip precautions. May be able to D/C home tomorrow and primarily mobilize with wheelchair and SPT. Will cont to follow per POC.   Follow Up Recommendations  Home health PT;Supervision/Assistance - 24 hour     Equipment Recommendations  Wheelchair (measurements PT);Wheelchair cushion (measurements PT);Other (comment)    Recommendations for Other Services       Precautions / Restrictions Precautions Precautions: Fall;Posterior Hip Precaution Comments: pt able to recall 2/3 hip precautions independently; cues throughout to adhere  Required Braces or Orthoses: Knee Immobilizer - Right Knee Immobilizer - Right:  (in bed) Restrictions Weight Bearing Restrictions: Yes RLE Weight Bearing: Partial weight bearing RLE Partial Weight Bearing Percentage or Pounds: 25-50    Mobility  Bed Mobility Overal bed mobility: Needs Assistance Bed Mobility: Supine to Sit     Supine to sit: Min assist;HOB elevated     General bed mobility comments: (A) to guard Rt LE off EOB; pt able to use handrails to pull trunk to sitting EOB; cues for precautions and sequencing   Transfers Overall transfer level: Needs assistance Equipment used: Rolling Buhl (2 wheeled) Transfers: Sit to/from Stand Sit to Stand: Min assist         General transfer comment: cues for hand placement and sequencing with transfers; min (A) to balance and maintain PWB status   Ambulation/Gait Ambulation/Gait assistance: Min assist Ambulation Distance (Feet): 8 Feet Assistive device: Rolling  Patricelli (2 wheeled) Gait Pattern/deviations: Step-to pattern;Decreased stance time - right;Decreased step length - left;Narrow base of support Gait velocity: decr due to pain Gait velocity interpretation: Below normal speed for age/gender General Gait Details: cues for gt sequencing with PWB status; pt demo good ability to maintain PWB status; min (A) to manage RW   Stairs            Wheelchair Mobility    Modified Rankin (Stroke Patients Only)       Balance Overall balance assessment: Needs assistance Sitting-balance support: Feet supported;No upper extremity supported Sitting balance-Leahy Scale: Fair     Standing balance support: During functional activity;Bilateral upper extremity supported Standing balance-Leahy Scale: Poor Standing balance comment: relies heavily on RW for Bil UE support                    Cognition Arousal/Alertness: Awake/alert Behavior During Therapy: Anxious Overall Cognitive Status: Within Functional Limits for tasks assessed       Memory: Decreased recall of precautions              Exercises Total Joint Exercises Ankle Circles/Pumps: AROM;Both;10 reps;Supine Long Arc Quad: AROM;Right;10 reps;Seated    General Comments        Pertinent Vitals/Pain Pain Assessment: 0-10 Pain Score: 10-Worst pain ever (0 at rest; 10 with activity) Pain Location: surgical site Pain Descriptors / Indicators: Guarding;Operative site guarding;Throbbing Pain Intervention(s): Monitored during session;Premedicated before session;Repositioned    Home Living                      Prior Function  PT Goals (current goals can now be found in the care plan section) Acute Rehab PT Goals Patient Stated Goal: to get back to independent  PT Goal Formulation: With patient Time For Goal Achievement: 06/01/14 Potential to Achieve Goals: Good Progress towards PT goals: Progressing toward goals    Frequency  Min 4X/week     PT Plan Current plan remains appropriate    Co-evaluation             End of Session Equipment Utilized During Treatment: Gait belt Activity Tolerance: Patient limited by pain Patient left: in chair;with call bell/phone within reach     Time: 0909-0933 PT Time Calculation (min): 24 min  Charges:  $Gait Training: 8-22 mins $Therapeutic Activity: 8-22 mins                    G CodesElie Confer [place], [place] 05/26/2014, 12:00 PM

## 2014-05-26 NOTE — Progress Notes (Signed)
Subjective: 2 Days Post-Op Procedure(s) (LRB): Revision Femoral Stem Right Hip (Right) Patient reports pain as moderate.  Hgb did not respond to one unit transfusion yesterday.  Looks good clinically.  Objective: Vital signs in last 24 hours: Temp:  [97.7 F (36.5 C)-99.7 F (37.6 C)] 97.9 F (36.6 C) (08/07 0542) Pulse Rate:  [83-97] 86 (08/07 0542) Resp:  [15-18] 18 (08/07 0542) BP: (90-123)/(30-73) 115/35 mmHg (08/07 0542) SpO2:  [94 %-100 %] 96 % (08/07 0542)  Intake/Output from previous day: 08/06 0701 - 08/07 0700 In: 686.3 [I.V.:373.8; Blood:312.5] Out: 945 [Urine:945] Intake/Output this shift:     Recent Labs  05/24/14 1349 05/25/14 0525 05/25/14 0825  HGB 14.0 7.4* 7.1*    Recent Labs  05/25/14 0525 05/25/14 0825  WBC 13.1* 13.8*  RBC 2.53* 2.43*  HCT 22.4* 21.7*  PLT 180 180    Recent Labs  05/25/14 0525 05/25/14 0825  NA 142 139  K 4.4 4.6  CL 106 104  CO2 27 26  BUN 15 15  CREATININE 0.61 0.63  GLUCOSE 132* 135*  CALCIUM 9.4 9.1    Recent Labs  05/24/14 1349  INR 1.08    Sensation intact distally Intact pulses distally Dorsiflexion/Plantar flexion intact Incision: scant drainage No cellulitis present  Assessment/Plan: 2 Days Post-Op Procedure(s) (LRB): Revision Femoral Stem Right Hip (Right) Plan for discharge tomorrow hopefully. Transfuse 2 units today and re-check in am.  Jean Rosenthal Y 05/26/2014, 7:03 AM

## 2014-05-26 NOTE — Progress Notes (Signed)
Utilization review completed. Terrina Docter, RN, BSN. 

## 2014-05-27 DIAGNOSIS — E43 Unspecified severe protein-calorie malnutrition: Secondary | ICD-10-CM | POA: Insufficient documentation

## 2014-05-27 LAB — CBC
HCT: 37.8 % (ref 36.0–46.0)
Hemoglobin: 12.5 g/dL (ref 12.0–15.0)
MCH: 28.9 pg (ref 26.0–34.0)
MCHC: 33.1 g/dL (ref 30.0–36.0)
MCV: 87.3 fL (ref 78.0–100.0)
PLATELETS: 178 10*3/uL (ref 150–400)
RBC: 4.33 MIL/uL (ref 3.87–5.11)
RDW: 16.9 % — AB (ref 11.5–15.5)
WBC: 14.6 10*3/uL — ABNORMAL HIGH (ref 4.0–10.5)

## 2014-05-27 LAB — TYPE AND SCREEN
ABO/RH(D): O POS
Antibody Screen: NEGATIVE
Unit division: 0
Unit division: 0
Unit division: 0

## 2014-05-27 NOTE — Progress Notes (Signed)
Physical Therapy Treatment Patient Details Name: Michele Johns MRN: [id number] DOB: Nov 15, 1929 Today's Date: 05/27/2014    History of Present Illness Michele Johns is an 80 y.o. Female who underwent THA on 03/01/14 with resulting pain and infection. Pt is now s/p THA removal of hardware secondary to infection on 05/24/14.     PT Comments    Patient is progressing towards physical therapy goals, ambulating up to 25 feet with min assist while using a rolling Tungate. Tolerating therapeutic exercises well and is motivated to improve. She is able to verbalize 3/3 posterior hip precautions and maintain ordered weight-bearing status. Patient will continue to benefit from skilled physical therapy services to further improve independence with functional mobility.    Follow Up Recommendations  SNF;Supervision/Assistance - 24 hour     Equipment Recommendations  Other (comment) (TBD at SNF)    Recommendations for Other Services       Precautions / Restrictions Precautions Precautions: Fall;Posterior Hip Precaution Comments: pt able to recall 3/3 hip precautions; re-educated pt on hip precautions with daughter present  Required Braces or Orthoses: Knee Immobilizer - Right Knee Immobilizer - Right:  (in bed) Restrictions Weight Bearing Restrictions: Yes RLE Weight Bearing: Partial weight bearing RLE Partial Weight Bearing Percentage or Pounds: 25-50    Mobility  Bed Mobility Overal bed mobility: Needs Assistance Bed Mobility: Supine to Sit     Supine to sit: Min assist;HOB elevated     General bed mobility comments: Min assist for RLE support out of bed. Pulled trunk up from PT arm for assist into sitting position.  Transfers Overall transfer level: Needs assistance Equipment used: Rolling [place] (2 wheeled) Transfers: Sit to/from Stand Sit to Stand: Min assist         General transfer comment: Min assist for boost from lowest bed setting. Good stability upon standing and able to  maintain WB status on RLE.   Ambulation/Gait Ambulation/Gait assistance: Min assist Ambulation Distance (Feet): 25 Feet Assistive device: Rolling [place] (2 wheeled) Gait Pattern/deviations: Step-to pattern;Decreased step length - right;Decreased step length - left;Decreased stance time - right;Antalgic;Decreased dorsiflexion - right   Gait velocity interpretation: Below normal speed for age/gender General Gait Details: Very slow and guarded gait. Poor activation of hip/knee flexion for swing limb advancement and significant foot drop on right. VC for [place] placement with min assist for stability.  Poor Coor control with turns. Educated on precautions when turning to prevent internal rotation of RLE.   Stairs            Wheelchair Mobility    Modified Rankin (Stroke Patients Only)       Balance                                    Cognition Arousal/Alertness: Awake/alert Behavior During Therapy: WFL for tasks assessed/performed Overall Cognitive Status: Within Functional Limits for tasks assessed                      Exercises Total Joint Exercises Ankle Circles/Pumps: AROM;Both;10 reps;Supine Quad Sets: Strengthening;Both;10 reps;Seated Gluteal Sets: Strengthening;Both;10 reps;Seated Short Arc Quad: Strengthening;Right;10 reps;Seated Heel Slides: AAROM;Right;5 reps;Seated Long Arc Quad: Right;10 reps;Strengthening;Seated    General Comments        Pertinent Vitals/Pain Pain Assessment: 0-10 Pain Score: 3  Pain Location: posterior hip Pain Descriptors / Indicators: Sore Pain Intervention(s): Limited activity within patient's tolerance;Monitored during session;Repositioned    Home Living  Prior Function            PT Goals (current goals can now be found in the care plan section) Acute Rehab PT Goals PT Goal Formulation: With patient Time For Goal Achievement: 06/01/14 Potential to Achieve Goals:  Good Progress towards PT goals: Progressing toward goals    Frequency  Min 4X/week    PT Plan Current plan remains appropriate    Co-evaluation             End of Session   Activity Tolerance: Patient tolerated treatment well Patient left: with call bell/phone within reach;with family/visitor present;in chair     Time: 5859-2924 PT Time Calculation (min): 30 min  Charges:  $Gait Training: 8-22 mins $Therapeutic Exercise: 8-22 mins                    G Codes:      [employer], [place]  Ellouise Newer 05/27/2014, 4:42 PM

## 2014-05-27 NOTE — Progress Notes (Signed)
Subjective: 3 Days Post-Op Procedure(s) (LRB): Revision Femoral Stem Right Hip (Right) Patient reports pain as mild.    Objective: Vital signs in last 24 hours: Temp:  [97.5 F (36.4 C)-98.7 F (37.1 C)] 98.6 F (37 C) (08/08 0520) Pulse Rate:  [86-105] 86 (08/08 0520) Resp:  [15-19] 15 (08/08 0520) BP: (114-130)/(36-48) 129/45 mmHg (08/08 0520) SpO2:  [91 %-98 %] 96 % (08/08 0520)  Intake/Output from previous day: 08/07 0701 - 08/08 0700 In: 2285 [P.O.:820; I.V.:200; Blood:1265] Out: 1000 [Urine:1000] Intake/Output this shift: Total I/O In: -  Out: 125 [Urine:125]   Recent Labs  05/24/14 1349 05/25/14 0525 05/25/14 0825 05/26/14 0702 05/27/14 0536  HGB 14.0 7.4* 7.1* 8.5* 12.5    Recent Labs  05/26/14 0702 05/27/14 0536  WBC 14.0* 14.6*  RBC 2.85* 4.33  HCT 25.6* 37.8  PLT 181 178    Recent Labs  05/25/14 0525 05/25/14 0825  NA 142 139  K 4.4 4.6  CL 106 104  CO2 27 26  BUN 15 15  CREATININE 0.61 0.63  GLUCOSE 132* 135*  CALCIUM 9.4 9.1    Recent Labs  05/24/14 1349  INR 1.08    Alert and oriented Dressing scant drainage  Assessment/Plan: 3 Days Post-Op Procedure(s) (LRB): Revision Femoral Stem Right Hip (Right) FL2 signed  Hbg 12.2 s/p blood transfusion Monitor WBC Dispo SNF early next week [place], [place] 05/27/2014, 8:30 AM

## 2014-05-28 LAB — CBC
HEMATOCRIT: 36.4 % (ref 36.0–46.0)
Hemoglobin: 12.1 g/dL (ref 12.0–15.0)
MCH: 28.4 pg (ref 26.0–34.0)
MCHC: 33.2 g/dL (ref 30.0–36.0)
MCV: 85.4 fL (ref 78.0–100.0)
PLATELETS: 244 10*3/uL (ref 150–400)
RBC: 4.26 MIL/uL (ref 3.87–5.11)
RDW: 16.8 % — ABNORMAL HIGH (ref 11.5–15.5)
WBC: 13.3 10*3/uL — AB (ref 4.0–10.5)

## 2014-05-28 LAB — BODY FLUID CULTURE: Culture: NO GROWTH

## 2014-05-28 NOTE — Progress Notes (Signed)
Subjective: 4 Days Post-Op Procedure(s) (LRB): Revision Femoral Stem Right Hip (Right) Patient reports pain as mild.  Mobility has been the limiting factor.  She looks much better after her transfusion Friday.  Therapy has recommended SNF and she and her daughter agree.  Labs not back this am - am interested in peripheral WBC that was high.  Cultures neg thus far.  Objective: Vital signs in last 24 hours: Temp:  [97.3 F (36.3 C)-98.3 F (36.8 C)] 98.2 F (36.8 C) (08/09 0536) Pulse Rate:  [85-97] 85 (08/09 0536) Resp:  [16-18] 16 (08/09 0536) BP: (132-138)/(50-51) 136/51 mmHg (08/09 0536) SpO2:  [94 %-97 %] 96 % (08/09 0536)  Intake/Output from previous day: 08/08 0701 - 08/09 0700 In: 50 [P.O.:50] Out: 775 [Urine:775] Intake/Output this shift:     Recent Labs  05/25/14 0825 05/26/14 0702 05/27/14 0536  HGB 7.1* 8.5* 12.5    Recent Labs  05/26/14 0702 05/27/14 0536  WBC 14.0* 14.6*  RBC 2.85* 4.33  HCT 25.6* 37.8  PLT 181 178    Recent Labs  05/25/14 0825  NA 139  K 4.6  CL 104  CO2 26  BUN 15  CREATININE 0.63  GLUCOSE 135*  CALCIUM 9.1   No results found for this basename: LABPT, INR,  in the last 72 hours  Sensation intact distally Intact pulses distally Dorsiflexion/Plantar flexion intact Incision: scant drainage No cellulitis present  Assessment/Plan: 4 Days Post-Op Procedure(s) (LRB): Revision Femoral Stem Right Hip (Right) Discharge to SNF next 1-2 days (FL-2 signed).  Tyaire Odem Y 05/28/2014, 8:16 AM

## 2014-05-28 NOTE — Progress Notes (Signed)
Clinical Social Work Department CLINICAL SOCIAL WORK PLACEMENT NOTE 05/28/2014  Patient:  [facility]  Account Number:  [id number] Admit date:  05/24/2014  Clinical Social Worker:  Blima Rich, Latanya Presser  Date/time:  05/28/2014 03:55 PM  Clinical Social Work is seeking post-discharge placement for this patient at the following level of care:   [place]   (*CSW will update this form in Epic as items are completed)   05/28/2014  Patient/family provided with [facility] Department of Clinical Social Work's list of facilities offering this level of care within the geographic area requested by the patient (or if unable, by the patient's family).  05/28/2014  Patient/family informed of their freedom to choose among providers that offer the needed level of care, that participate in Medicare, Medicaid or managed care program needed by the patient, have an available bed and are willing to accept the patient.  05/28/2014  Patient/family informed of [facility]' ownership interest in [facility], as well as of the fact that they are under no obligation to receive care at this facility.  PASARR submitted to EDS on 03/03/2014 PASARR number received on 03/03/2014  FL2 transmitted to all facilities in geographic area requested by pt/family on  05/28/2014 FL2 transmitted to all facilities within larger geographic area on   Patient informed that his/her managed care company has contracts with or will negotiate with  certain facilities, including the following:     Patient/family informed of bed offers received:   Patient chooses bed at  Physician recommends and patient chooses bed at    Patient to be transferred to  on   Patient to be transferred to facility by  Patient and family notified of transfer on  Name of family member notified:    The following physician request were entered in Epic:   Additional Comments: Patient has an existing PASARR [id number].

## 2014-05-28 NOTE — Progress Notes (Signed)
Clinical Education officer, museum (CSW) met with patient at daughter at bedside. Patient wants to go to [facility] for rehab. Patient has used around 80 medicare days at rehab in May 2015. CSW explained that patient will need to have a 60 day wellness period before medicare days start over. If this is not met patient can go back to rehab on day 30 and will be in her co-pay days. Patient and daughter verbalized their understanding. CSW will continue to follow and assist.   Blima Rich, Latanya Presser Weekend CSW [phone]

## 2014-05-29 LAB — ANAEROBIC CULTURE

## 2014-05-29 MED ORDER — DSS 100 MG PO CAPS: 100.0000 mg | ORAL_CAPSULE | Freq: Two times a day (BID) | ORAL | Status: AC

## 2014-05-29 MED ORDER — POLYETHYLENE GLYCOL 3350 17 G PO PACK: 17.0000 g | PACK | Freq: Every day | ORAL | Status: AC | PRN

## 2014-05-29 MED ORDER — BISACODYL 10 MG RE SUPP: 10.0000 mg | Freq: Every day | RECTAL | Status: AC | PRN

## 2014-05-29 NOTE — Discharge Summary (Signed)
Physician Discharge Summary  Patient ID: Michele Johns MRN: [id number] DOB/AGE: 78-Aug-1931 78 y.o.  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 Lung mass   . COPD (chronic obstructive pulmonary disease)   . Osteoporosis   . Hypoxemia   . Anemia   . History of blood transfusion 02/2014    "right after hip OR"  . Lung cancer, upper lobe     left lung    Surgeries: Procedure(s): Revision Femoral Stem Right Hip on 05/24/2014   Consultants (if any):  none  Discharged Condition: Improved  Hospital Course:  This 80 year old female, former smoker, was initially seen  in May with a femoral neck fracture. She was taken to the operating  room, underwent a press-fit total hip arthroplasty since she was active.  Chest x-ray showed some abnormality. Later worked up with CT and needle  biopsy, PET scan, CT scan of the head and appears that she has  bronchogenic cancer of the left lung. She is scheduled for radiation.  After the surgery, she returned to our office couple of weeks after  surgery and had a fracture of the greater trochanter, then returned with  increased pain and x-ray showed scalloping bone, destructive lesions, a  crack in the subtrochanteric region with instability of the femoral stem  with either severe infection or more likely metastatic tumor destruction  of the proximal femur. The patient's femoral head had not been sent for  pathologic exam since she had characteristic typical fracture of the  femoral neck and no abnormal bone was noted at the time of surgery.  She was given perioperative antibiotics:      Anti-infectives   None    Pt was slow with activity.   Allowed 50% partial weight bearing to right LE.  Knee immobilizer used for posterior total hip precautions. PRAFO boot used for weakness in right foot DF/PF and to prevent heel decubitus.  Ankle function improved during hospital stay.  Residual weakness in right leg left pt unable to straight leg raise and unable to flex knee and hip.  Knee immobilizer was continued for in bed use to assist with precautions until strength improved.   Pt was prepped for radiation therapy prior to her surgery and will be able to resume this plan after discharge.   She was given sequential compression devices, early ambulation, and aspirin for DVT prophylaxis.  She benefited maximally from the hospital stay and there were no complications.    Recent vital signs:  Filed Vitals:   05/29/14 0626  BP: 120/47  Pulse: 80  Temp: 98.5 F (36.9 C)  Resp: 16    Recent laboratory studies:  Lab Results  Component Value Date   HGB 12.1 05/28/2014   HGB 12.5 05/27/2014   HGB 8.5* 05/26/2014   Lab Results  Component Value Date   WBC 13.3* 05/28/2014   PLT 244 05/28/2014   Lab Results  Component Value Date   INR 1.08 05/24/2014   Lab Results  Component Value Date   NA 139 05/25/2014   K 4.6 05/25/2014   CL 104 05/25/2014   CO2 26 05/25/2014   BUN 15 05/25/2014   CREATININE 0.63 05/25/2014   GLUCOSE 135* 05/25/2014    Discharge  Medications:     Medication List         aspirin EC 325 MG tablet  Take 1 tablet (325 mg total) by mouth daily.     bisacodyl 10 MG suppository  Commonly known as:  DULCOLAX  Place 1 suppository (10 mg total) rectally daily as needed for moderate constipation.     calcium carbonate 1250 MG tablet  Commonly known as:  OS-CAL - dosed in mg of elemental calcium  Take 1 tablet (1,250 mg total) by mouth daily with breakfast.     DSS 100 MG Caps  Take 100 mg by mouth 2 (two) times daily.     HYDROcodone-acetaminophen 5-325 MG per tablet  Commonly known as:  NORCO/VICODIN  Take 1 tablet by mouth  every 6 (six) hours as needed for moderate pain.     multivitamin with minerals Tabs tablet  Take 1 tablet by mouth daily.     oxyCODONE-acetaminophen 5-325 MG per tablet  Commonly known as:  ROXICET  Take 1-2 tablets by mouth every 4 (four) hours as needed.     polyethylene glycol packet  Commonly known as:  MIRALAX / GLYCOLAX  Take 17 g by mouth daily as needed for mild constipation.     vitamin D (CHOLECALCIFEROL) 400 UNITS tablet  Take 2 tablets (800 Units total) by mouth daily.        Diagnostic Studies: Mr Kizzie Fantasia Contrast  12-May-2014   CLINICAL DATA:  79 year old female with lung cancer, staging. Initial encounter.  EXAM: MRI HEAD WITHOUT AND WITH CONTRAST  TECHNIQUE: Multiplanar, multiecho pulse sequences of the brain and surrounding structures were obtained without and with intravenous contrast.  CONTRAST:  66mL MULTIHANCE GADOBENATE DIMEGLUMINE 529 MG/ML IV SOLN  COMPARISON:  None.  FINDINGS: Incidental developmental venous anomaly right cerebellar hemisphere. Series 11, image 17.  No abnormal enhancement identified. No midline shift, mass effect, or evidence of intracranial mass lesion.  Cerebral volume is within normal limits for age. No restricted diffusion to suggest acute infarction. No midline shift, mass effect, evidence of mass lesion, ventriculomegaly, extra-axial collection or acute intracranial hemorrhage. Cervicomedullary junction and pituitary are within normal limits. Grossly negative visualized cervical spine. Major intracranial vascular flow voids are preserved. Mild for age scattered nonspecific mostly periventricular white matter T2 and FLAIR hyperintensity. Similar patchy T2 hyperintensity in the pons.  Small oval 9 mm enhancing left temporal bone lesion with conspicuous diffusion signal (series 11 image there is degree page 16). Bone marrow signal elsewhere within normal limits.  Visualized scalp soft tissues are within normal limits. Visible internal auditory  structures appear normal. Mastoids are clear. Paranasal sinuses are clear. Postoperative changes to the globes.  IMPRESSION: 1. No metastatic disease to the brain. 2. Small 9 mm left temporal bone lesion, suspicious for early osseous metastatic disease to the skull.   Electronically Signed   By: [name] M.D.   On: May 12, 2014 15:59   Dg Femur Right Port  05/24/2014   CLINICAL DATA:  Infected right hip prosthesis.  EXAM: PORTABLE RIGHT FEMUR - 2 VIEW  COMPARISON:  03/07/14  FINDINGS: Exam is technically suboptimal due to positioning. A bipolar right hip prosthesis is demonstrated. There is a comminuted fracture of the proximal femur surrounding the femoral component of the prosthesis which shows associated osteolysis. This is likely a pathologic fracture secondary to osteomyelitis.  IMPRESSION: Comminuted pathologic fracture of the proximal femur surrounding the femoral component of the hip prosthesis, suspicious for osteomyelitis.   Electronically Signed  By: Earle Gell M.D.   On: 05/24/2014 17:00    Disposition: 01-Home or Self Care  Discharge Instructions   Call MD / Call 911    Complete by:  As directed   If you experience chest pain or shortness of breath, CALL 911 and be transported to the hospital emergency room.  If you develope a fever above 101 F, pus (white drainage) or increased drainage or redness at the wound, or calf pain, call your surgeon's office.     Constipation Prevention    Complete by:  As directed   Drink plenty of fluids.  Prune juice may be helpful.  You may use a stool softener, such as Colace (over the counter) 100 mg twice a day.  Use MiraLax (over the counter) for constipation as needed.     Diet - low sodium heart healthy    Complete by:  As directed      Discharge instructions    Complete by:  As directed   Call if fever or chills or increased drainage. Go to ER if acutely short of breath or call for ambulance. Return for follow up in 2 weeks. Partial weight bear  on the surgical leg unless told otherwise. In house walking for first 2 weeks.  May report to Dr Lois Huxley office for initiation of radiation therapy     Discharge wound care:    Complete by:  As directed   If you have a hip bandage, keep it clean and dry.  Change your bandage as needed or daily.    Pat dry after bathing.  DO NOT put lotion or powder on your incision.     Do not sit on low chairs, stoools or toilet seats, as it may be difficult to get up from low surfaces    Complete by:  As directed      Follow the hip precautions as taught in Physical Therapy    Complete by:  As directed      Increase activity slowly as tolerated    Complete by:  As directed      Non weight bearing    Complete by:  As directed   50%  Laterality:  right  Extremity:  Lower     Partial weight bearing    Complete by:  As directed   Laterality:  right  Extremity:  Lower     Posterior total hip precautions    Complete by:  As directed            Follow-up Information   Follow up with Marybelle Killings, MD. Schedule an appointment as soon as possible for a visit in 2 weeks.   Specialty:  Orthopedic Surgery   Contact information:   [place] [address] [phone]       Call Blair Promise, MD. (please call to arrange follow up appt to begin radiation treatment.)    Specialty:  Radiation Oncology   Contact information:   [address] [place] [address] [phone]        Signed: Epimenio Foot 05/29/2014, 12:13 PM

## 2014-05-29 NOTE — Progress Notes (Signed)
Subjective: 5 Days Post-Op Procedure(s) (LRB): Revision Femoral Stem Right Hip (Right) Patient reports pain as mild.   "this brace is keeping me from moving."  Knee imm is bothering her.   OOB Saturday but not yesterday. Will be going to NH when bed available.  Wants [facility]. Objective: Vital signs in last 24 hours: Temp:  [98.3 F (36.8 C)-98.5 F (36.9 C)] 98.5 F (36.9 C) (08/10 0626) Pulse Rate:  [80-91] 80 (08/10 0626) Resp:  [16-18] 16 (08/10 0626) BP: (120-140)/(47-52) 120/47 mmHg (08/10 0626) SpO2:  [95 %-97 %] 97 % (08/10 0626)  Intake/Output from previous day: 08/09 0701 - 08/10 0700 In: 40 [P.O.:40] Out: -  Intake/Output this shift:     Recent Labs  05/27/14 0536 05/28/14 0755  HGB 12.5 12.1    Recent Labs  05/27/14 0536 05/28/14 0755  WBC 14.6* 13.3*  RBC 4.33 4.26  HCT 37.8 36.4  PLT 178 244   No results found for this basename: NA, K, CL, CO2, BUN, CREATININE, GLUCOSE, CALCIUM,  in the last 72 hours No results found for this basename: LABPT, INR,  in the last 72 hours  Neurovascular intact Sensation intact distally Dorsiflexion/Plantar flexion intact Incision: no drainage Ankle function has returned and she has strength with DF and PF Can quad set but no straight leg raise yet. WBC trending down.  hgb stable. Assessment/Plan: 5 Days Post-Op Procedure(s) (LRB): Revision Femoral Stem Right Hip (Right) Up with therapy Discharge to SNF she could be ready today if bed available.  If not ok to wait until tomorrow as she has had very little activity. Will DC knee imm for when she is in chair and out of bed, but she will continue to wear it in bed and when sleeping.  Continue PRAFO to when in bed only to prevent heel decubitus as she cannot lift leg yet.  Jabri Blancett M 05/29/2014, 8:26 AM

## 2014-05-29 NOTE — Progress Notes (Signed)
Report called to [employer], Daphene Jaeger.

## 2014-05-29 NOTE — Social Work (Signed)
Patient for discharge to SNF bed at [facility] today. Plans confirmed with patient and family who are in agreement. SNF is prepared for patient and we will  plan transfer via [facility].   Charlene Brooke, MSW Clinical Social Worker [phone]             .

## 2014-05-29 NOTE — Progress Notes (Signed)
Physical Therapy Treatment Patient Details Name: Michele Johns MRN: [id number] DOB: Dec 20, 1929 Today's Date: 05/29/2014    History of Present Illness Michele Johns is an 80 y.o. Female who underwent THA on 03/01/14 with resulting pain and infection. Pt is now s/p THA removal of hardware secondary to infection on 05/24/14.     PT Comments    Pt progressing towards all goals but con't to have limited active ROM of R hip, significant R LE weakness and decreased overall activity tolerance. Pt to con't to benefit from ST-SNF upon d/c.   Follow Up Recommendations  SNF;Supervision/Assistance - 24 hour     Equipment Recommendations       Recommendations for Other Services       Precautions / Restrictions Precautions Precautions: Fall;Posterior Hip Precaution Booklet Issued: Yes (comment) Precaution Comments: pt able to recall 3/3 hip precautions; re-educated pt on hip precautions with daughter present  Required Braces or Orthoses: Knee Immobilizer - Right (Lenard boot on R LE) Knee Immobilizer - Right:  (when in bed) Restrictions Weight Bearing Restrictions: Yes RLE Weight Bearing: Partial weight bearing RLE Partial Weight Bearing Percentage or Pounds: 25-50    Mobility  Bed Mobility Overal bed mobility: Needs Assistance Bed Mobility: Supine to Sit     Supine to sit: Min assist;HOB elevated Sit to supine: Min assist   General bed mobility comments: assist for R LE Mangement and to bring hips to EOB, pt used PT to pull trunk upright  Transfers Overall transfer level: Needs assistance Equipment used: Rolling Bellamy (2 wheeled) Transfers: Sit to/from Stand Sit to Stand: Min assist         General transfer comment: max v/c's for hand placement and optimal L LE mangement  Ambulation/Gait Ambulation/Gait assistance: Min assist Ambulation Distance (Feet): 30 Feet Assistive device: Rolling Meddings (2 wheeled) Gait Pattern/deviations: Decreased stride length;Step-to  pattern;Decreased step length - right;Decreased stance time - right;Antalgic     General Gait Details: pt maintained R LE PWB, very slow/guarded. 2 standing rest breaks due to fatigue   Stairs            Wheelchair Mobility    Modified Rankin (Stroke Patients Only)       Balance           Standing balance support: During functional activity Standing balance-Leahy Scale: Poor Standing balance comment: required used of AD/bilat UE support due to R LE PWB                    Cognition Arousal/Alertness: Awake/alert Behavior During Therapy: WFL for tasks assessed/performed Overall Cognitive Status: Within Functional Limits for tasks assessed                      Exercises Total Joint Exercises Ankle Circles/Pumps: AROM;Both;10 reps;Supine Quad Sets: Strengthening;Both;10 reps;Seated Gluteal Sets: Strengthening;Both;10 reps;Seated Heel Slides: AROM;Right;10 reps;Supine Hip ABduction/ADduction: AROM;Right;5 reps;Seated;Other (comment) Long Arc Quad: Right;10 reps;Strengthening;Seated    General Comments        Pertinent Vitals/Pain Pain Assessment: 0-10 Pain Score: 4  Pain Location: posterior hip Pain Descriptors / Indicators: Constant Pain Intervention(s): Monitored during session    Home Living                      Prior Function            PT Goals (current goals can now be found in the care plan section) Acute Rehab PT Goals Patient Stated Goal: bend my hip Progress  towards PT goals: Progressing toward goals    Frequency  Min 4X/week    PT Plan Current plan remains appropriate    Co-evaluation             End of Session Equipment Utilized During Treatment: Gait belt Activity Tolerance: Patient tolerated treatment well Patient left: in chair;with call bell/phone within reach     Time: 0827-0859 PT Time Calculation (min): 32 min  Charges:  $Gait Training: 8-22 mins $Therapeutic Exercise: 8-22 mins                     G Codes:      Kingsley Callander 05/29/2014, 9:09 AM  Kittie Plater, PT, DPT Pager #: [phone] Office #: [phone]

## 2014-05-29 NOTE — Discharge Instructions (Signed)
Keep hip incision dry for 7 days post op then may wet while bathing. Therapy daily for ambulation and gait training. ROM and strengthening of the LEs. Knee immobilizer to surgical leg while in bed and sleeping.  May remove for out of bed to chair and ambulation.  PRAFO boot to right foot while in bed to prevent heel decubitus until pt has ability to lift leg independently. Call if fever or chills or increased drainage. Go to ER if acutely short of breath or call for ambulance. Return for follow up in 2 weeks. Partial weight bear on the surgical leg unless told otherwise. In house walking for first 2 weeks.  May report to Dr Lois Huxley office for initiation of radiation therapy

## 2014-05-30 ENCOUNTER — Ambulatory Visit: Payer: Medicare Other | Admitting: Radiation Oncology

## 2014-06-01 ENCOUNTER — Ambulatory Visit: Payer: Medicare Other | Admitting: Radiation Oncology

## 2014-06-06 ENCOUNTER — Ambulatory Visit: Payer: Medicare Other | Admitting: Radiation Oncology

## 2014-06-16 NOTE — Progress Notes (Signed)
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 The patient was found to have a right femoral nontraumatic fracture. The patient underwent surgical correction of this issue. Patient developed hypoxemia and sinus tachycardia postop. A subsequent CT scan of the chest showed an irregular mass anterior medial left upper lobe measuring approximately 3.5 cm. Chest CT scan also showed enlarged subcarinal lymphadenopathy measuring 3.2 x 1.8 cm.   Biopsies revealed:  04/18/14  Diagnosis  Lung, needle/core biopsy(ies), left upper lobe  - ADENOCARCINOMA.   Tobacco/Marijuana/Snuff/ETOH use: reports that she quit smoking about 20 years ago. Her smoking use included Cigarettes. She has a 7.5 pack-year smoking history. She has never used smokeless tobacco. She reports that she does not drink alcohol or use illicit drugs.   Past/Anticipated interventions by cardiothoracic surgery, if any: 04/10/14 - Procedure: ENDOBRONCHIAL ULTRASOUND; Surgeon: Rigoberto Noel, MD; Location: [facility]; Service: Cardiopulmonary; Laterality: Bilateral;, 04/18/14 - CT Biopsy of left upper lung mass   Past/Anticipated interventions by medical oncology, if any: no  Signs/Symptoms  Weight changes, if any: yes has lost more than 10 lbs over last 3 months. Poor appetite, ate a few bites of eggs and oatmeal this am, coffe, drank 1 ensure for lunch,  Respiratory complaints, if any: no  Hemoptysis, if any: no  Pain issues, if any: pain in right  hip sometimes, says none at present, took 2 pain pills this am, in w/c, energy level not good stated  SAFETY ISSUES: Yes, weak, unsteady, right hip  Prior radiation? no  Pacemaker/ICD? no  Possible current pregnancy?no  Is the patient on methotrexate? no  Current Complaints / other details:resides at [place], [facility] facility), d/c plans for September 11,2015, Winfield Cunas daughter with her, fell in bathromm this past Monday in bathroom at [facility], has steri strips and bruising over left eyebrow, stated her Forge slid away from her

## 2014-06-21 ENCOUNTER — Ambulatory Visit: Payer: Medicare Other

## 2014-06-21 ENCOUNTER — Ambulatory Visit: Payer: Medicare Other | Admitting: Radiation Oncology

## 2014-06-22 ENCOUNTER — Encounter: Payer: Self-pay | Admitting: Radiation Oncology

## 2014-06-22 ENCOUNTER — Ambulatory Visit
Admission: RE | Admit: 2014-06-22 | Discharge: 2014-06-22 | Disposition: A | Discharge status: Home / Self Care | Payer: Medicare Other | Source: Ambulatory Visit | Attending: Radiation Oncology | Admitting: Radiation Oncology

## 2014-06-22 VITALS — BP 148/50 | HR 103 | Temp 97.8°F | Resp 20 | Ht 62.0 in | Wt 89.7 lb

## 2014-06-22 DIAGNOSIS — Z510 Encounter for antineoplastic radiation therapy: Secondary | ICD-10-CM | POA: Diagnosis present

## 2014-06-22 DIAGNOSIS — C3410 Malignant neoplasm of upper lobe, unspecified bronchus or lung: Secondary | ICD-10-CM

## 2014-06-22 DIAGNOSIS — C3492 Malignant neoplasm of unspecified part of left bronchus or lung: Secondary | ICD-10-CM

## 2014-06-22 NOTE — Progress Notes (Signed)
Please see the Nurse Progress Note in the MD Initial Consult Encounter for this patient. 

## 2014-06-22 NOTE — Progress Notes (Signed)
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 Earlier this year the patient underwent planning to begin stereotactic body radiation therapy directed at her clinical stage I adenocarcinoma of the left upper lobe. The patient actually underwent set up for her treatment but never started her SBRT. When leaving the beauty salon the patient began having difficulty moving her right leg and later developed pain. She was found to have a fracture of the right femur. The patient was initially felt to have possibly osteomyelitis causing the fracture however at the time of surgery she was felt to have a pathologic fracture from metastatic carcinoma. 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 High-grade nuclear epidermoid carcinoma have been  reported to originate in the lung with documented extra pulmonic metastasis. The patient's PET scan did not show any activity in the salivary gland region and on exam today no suspicious areas are palpated. Patient presents today to be considered for postoperative treatments.  She continues to have a lot of discomfort in the right pelvis area and is unable to walk without assistance of Armond and others.  The patient's daughter has noticed a significant cognitive decline in the patient over the past several weeks.  The patient is residing at [facility] presently but will be discharged in approximately 10 days to home.                           ALLERGIES:  is allergic to  penicillins.  Meds: Current Outpatient Prescriptions  Medication Sig Dispense Refill  . aspirin EC 325 MG tablet Take 1 tablet (325 mg total) by mouth daily.  30 tablet  0  . bisacodyl (DULCOLAX) 10 MG suppository Place 1 suppository (10 mg total) rectally daily as needed for moderate constipation.  12 suppository  0  . calcium carbonate (OS-CAL - DOSED IN MG OF ELEMENTAL CALCIUM) 1250 MG tablet Take 1 tablet (1,250 mg total) by mouth daily with breakfast.      . cholecalciferol 400 UNITS tablet Take 2 tablets (800 Units total) by mouth daily.  30 each  0  . docusate sodium 100 MG CAPS Take 100 mg by mouth 2 (two) times daily.  10 capsule  0  . HYDROcodone-acetaminophen (NORCO/VICODIN) 5-325 MG per tablet Take 1 tablet by mouth every 6 (six) hours as needed for moderate pain.      . Multiple Vitamin (MULTIVITAMIN WITH MINERALS) TABS tablet Take 1 tablet by mouth daily.      Marland Kitchen oxyCODONE-acetaminophen (ROXICET) 5-325 MG per tablet Take 1-2 tablets by mouth every 4 (four) hours as needed.  60 tablet  0  . polyethylene glycol (MIRALAX / GLYCOLAX) packet Take 17 g by mouth daily as needed for mild constipation.  14 each  0   No current facility-administered medications for this encounter.    Physical Findings: The patient is in no acute distress.   height is 5\' 2"  (1.575 m) and weight is 89 lb 11.2 oz (40.688  kg). Her oral temperature is 97.8 F (36.6 C). Her blood pressure is 148/50 and her pulse is 103. Her respiration is 20 and oxygen saturation is 100%. .   The patient does show some confusion and is somewhat slow to answer questions.  She is accompanied by her daughter on evaluation today.  Palpation along the parotid and submandibular areas reveals no suspicious masses. The lungs are clear. The heart has a regular rhythm and rate.   Lab Findings: Lab Results  Component Value Date   WBC 13.3* 05/28/2014   HGB 12.1 05/28/2014   HCT 36.4 05/28/2014   MCV 85.4 05/28/2014   PLT 244 05/28/2014       Radiographic Findings: Dg Femur Right Port  05/24/2014   CLINICAL DATA:  Infected right hip prosthesis.  EXAM: PORTABLE RIGHT FEMUR - 2 VIEW  COMPARISON:  03/07/14  FINDINGS: Exam is technically suboptimal due to positioning. A bipolar right hip prosthesis is demonstrated. There is a comminuted fracture of the proximal femur surrounding the femoral component of the prosthesis which shows associated osteolysis. This is likely a pathologic fracture secondary to osteomyelitis.  IMPRESSION: Comminuted pathologic fracture of the proximal femur surrounding the femoral component of the hip prosthesis, suspicious for osteomyelitis.   Electronically Signed   By: Earle Gell M.D.   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  The patient would be a good candidate for postoperative treatments directed at the right pelvis area. I discussed the treatment course and side effects with the patient and her daughter. She appears to understand and wishes to proceed with postoperative treatments.    Plan:  Simulation and planning September 9 with treatments to begin soon afterward. Anticipate between 5 and 10 postoperative treatments.  I have recommended to  the daughter that the patient's primary care physician evaluate her cognitive function since there appears to be significant decline over the past few weeks.  ____________________________________ Blair Promise, MD

## 2014-06-28 ENCOUNTER — Ambulatory Visit: Payer: Medicare Other | Admitting: Radiation Oncology

## 2014-07-10 ENCOUNTER — Telehealth: Payer: Self-pay | Admitting: Dietician

## 2014-07-10 NOTE — Telephone Encounter (Signed)
SNF

## 2014-08-25 IMAGING — CR DG CHEST 1V PORT
1 series · 1 of 1 positions shown · non-contrast
Comparison: Portable chest radiographs 04/10/2014.

CLINICAL DATA: Post left lung biopsy

EXAM:
PORTABLE CHEST - 1 VIEW

[AP]
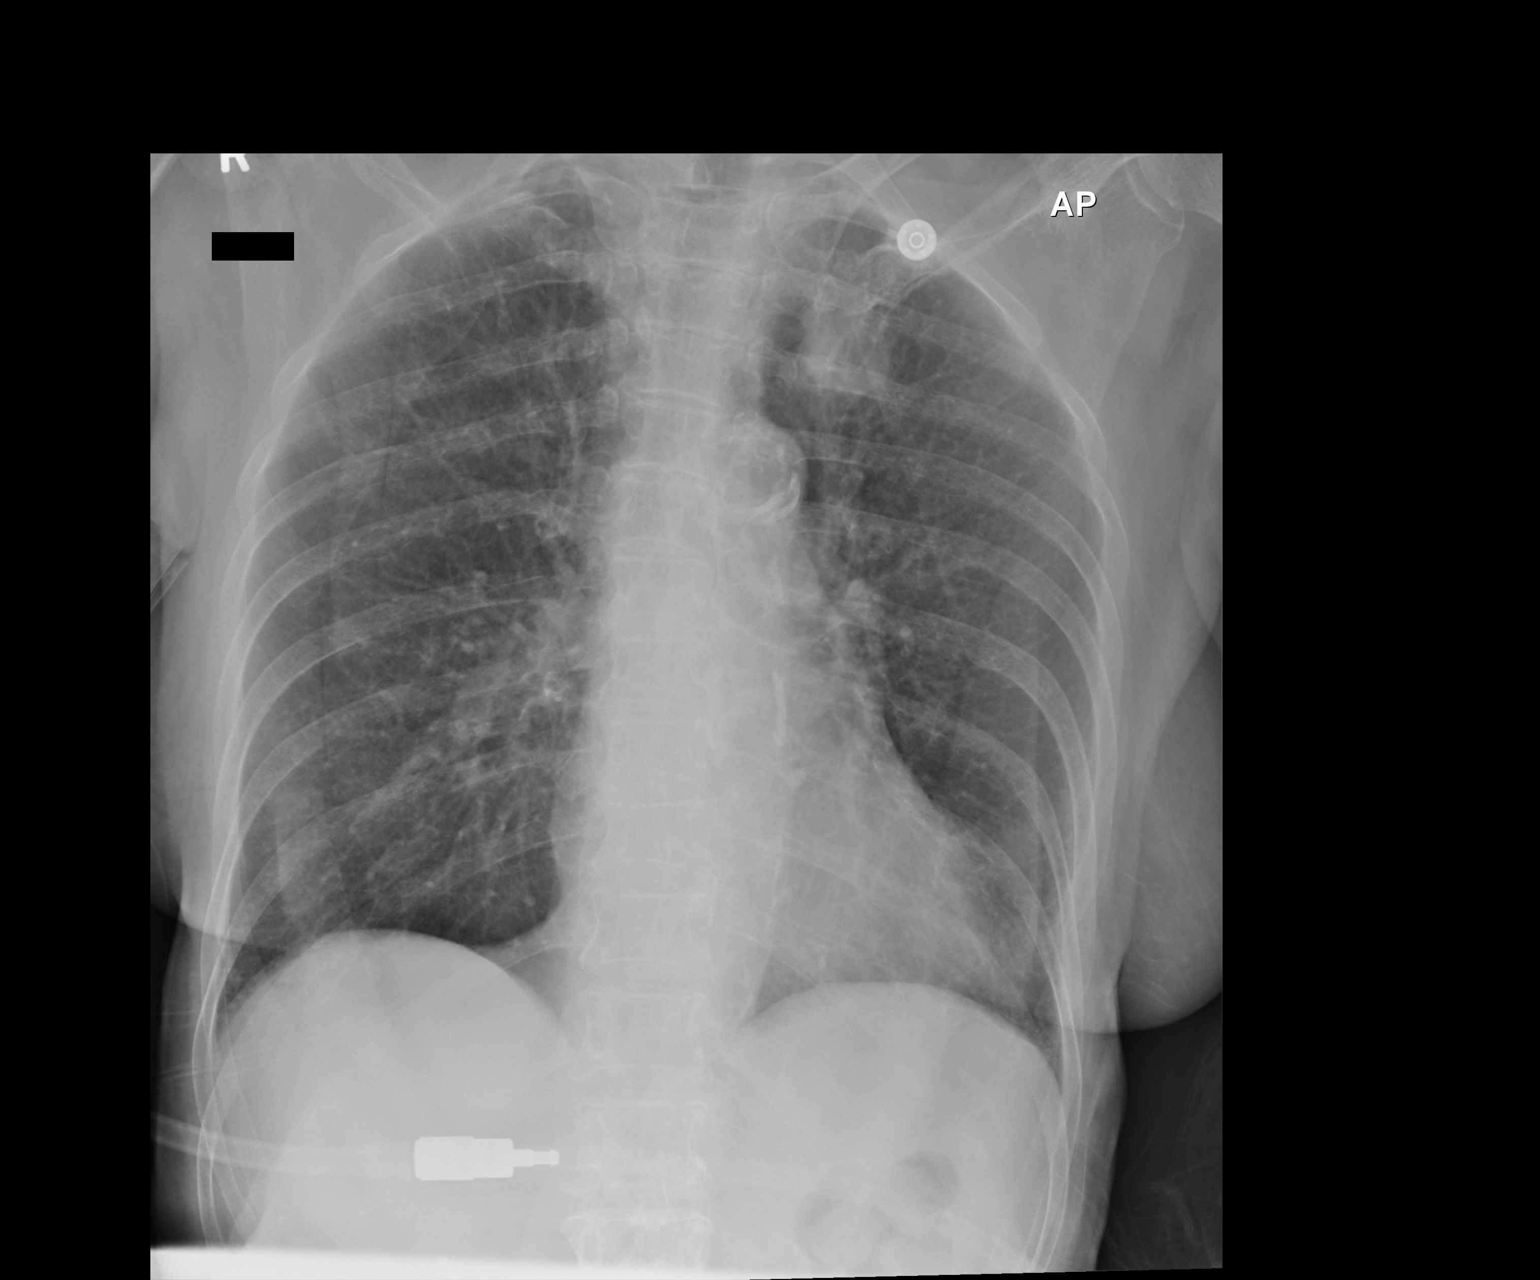

[1 of 1 positions shown; findings below may reference images not displayed]

FINDINGS: The heart size and mediastinal contours are within normal limits. No
pneumothorax. Stable left upper lobe nodule. Lungs otherwise clear.
Atherosclerotic calcifications in the aorta. Mild degenerative
changes within the shoulders. No acute osseous abnormalities.
IMPRESSION: No pneumothorax.

Stable left upper lobe pulmonary nodule consistent with patient's
history.

## 2014-08-25 IMAGING — CT CT BIOPSY
1 of 2 series · 12 of 32 positions shown, 18 images · non-contrast
Comparison: none

CLINICAL DATA: 83-year-old with a left upper lung lesion.

[Series 2: i-spiral 5.0 b40f · axial · 0.59mm/px · z∈[+1354,+1452]mm · 12 of 34 slices shown, 18 images]
[im 3/34  soft-tissue]
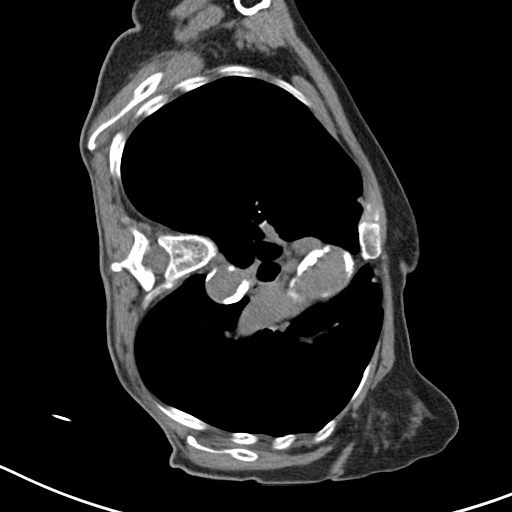
[im 3/34  bone]
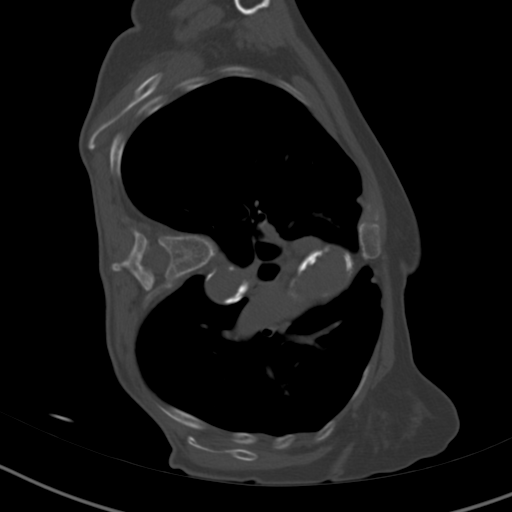
[im 6/34  soft-tissue]
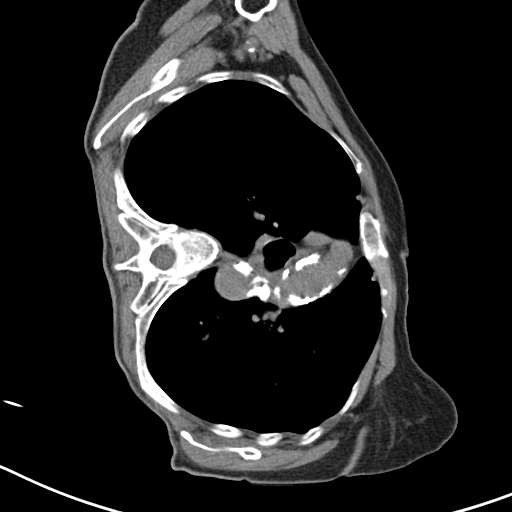
[im 8/34  soft-tissue]
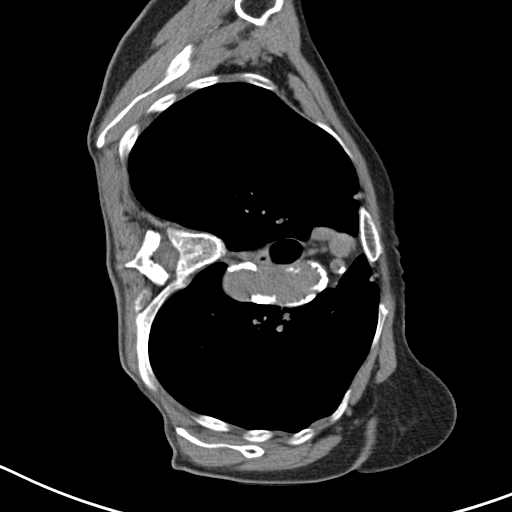
[im 11/34  soft-tissue]
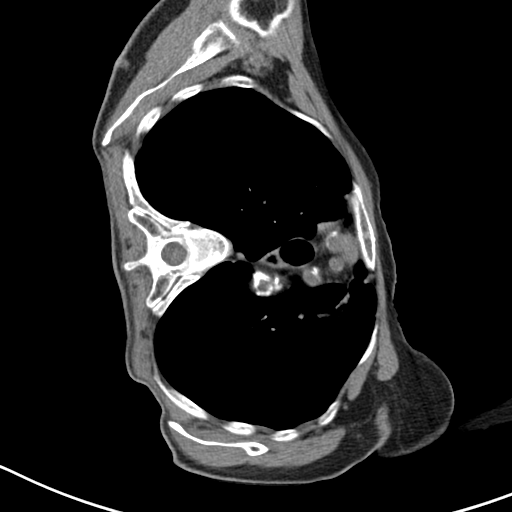
[im 13/34  soft-tissue]
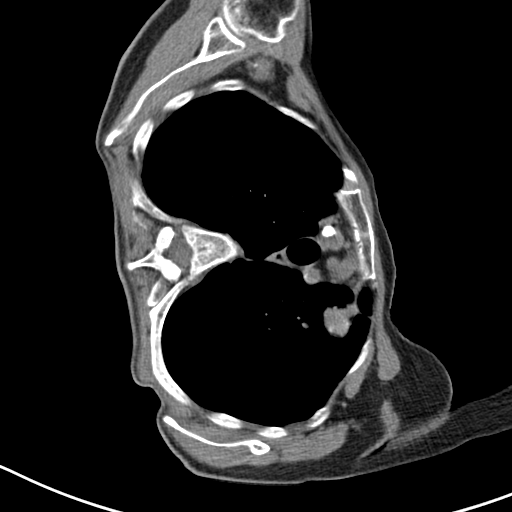
[im 16/34  soft-tissue]
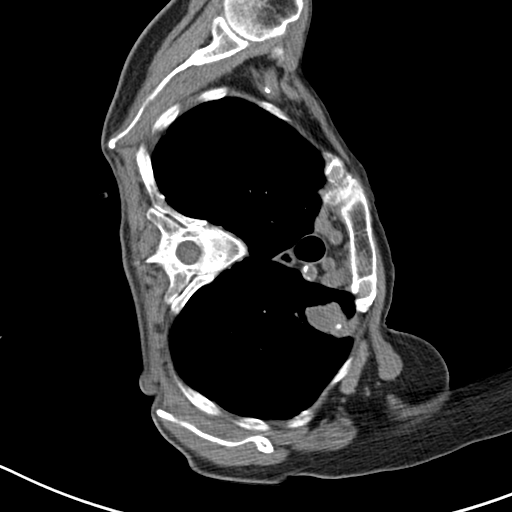
[im 18/34  soft-tissue]
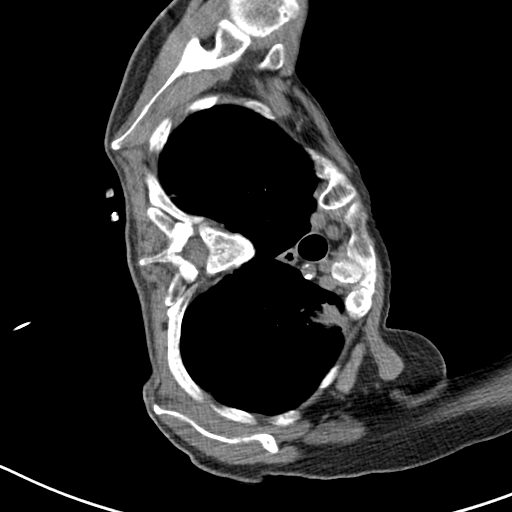
[im 21/34  soft-tissue]
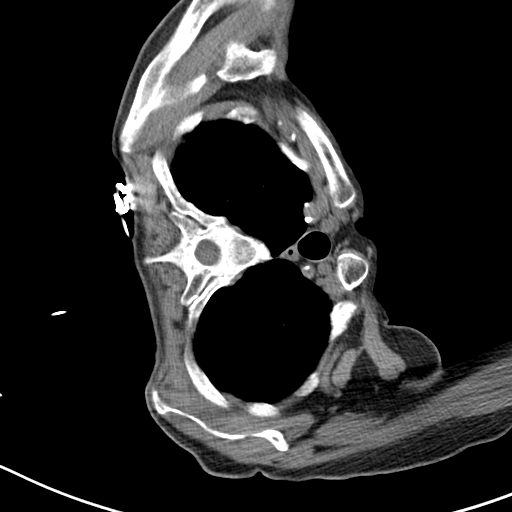
[im 23/34  soft-tissue]
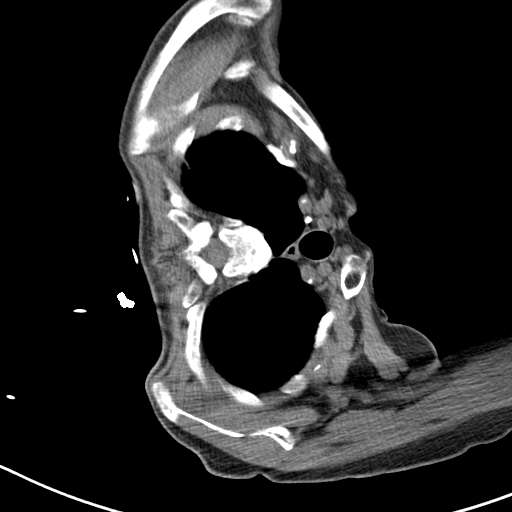
[im 23/34  lung]
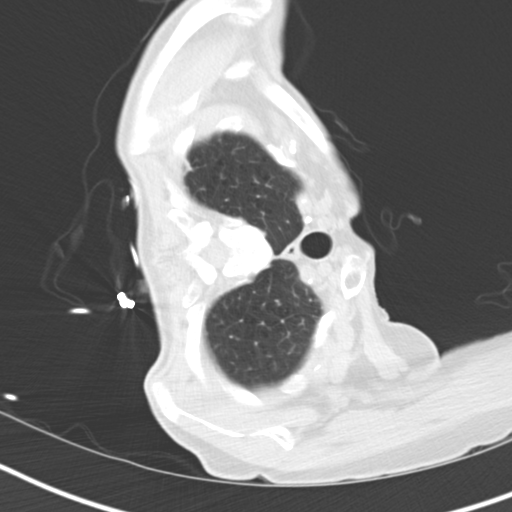
[im 23/34  bone]
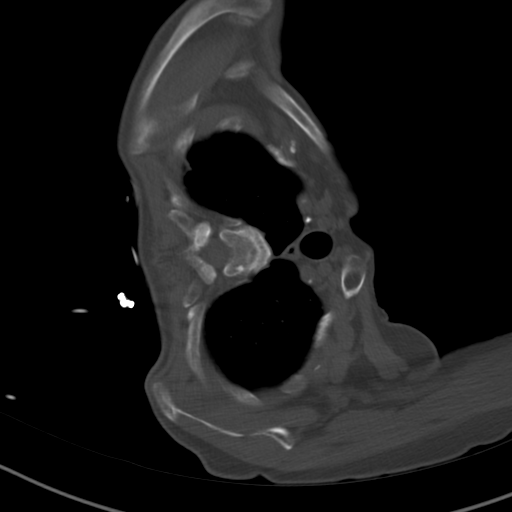
[im 26/34  soft-tissue]
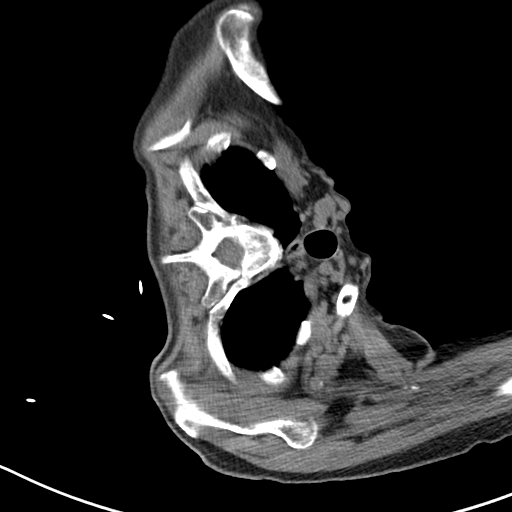
[im 26/34  lung]
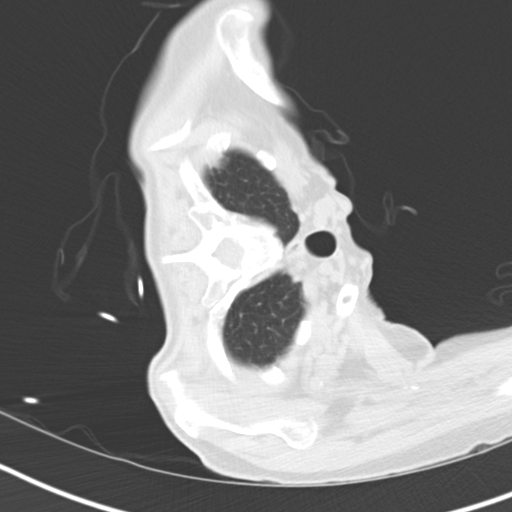
[im 28/34  soft-tissue]
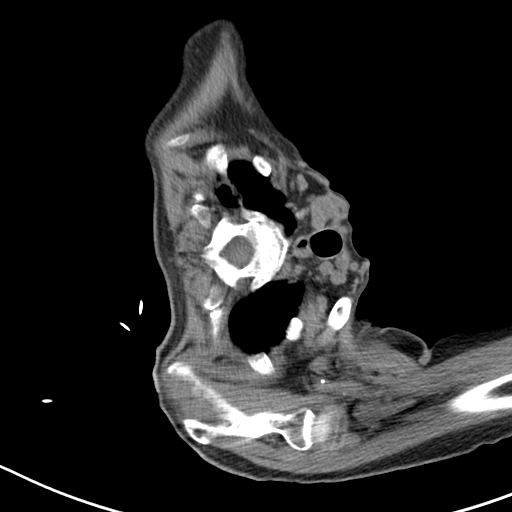
[im 28/34  lung]
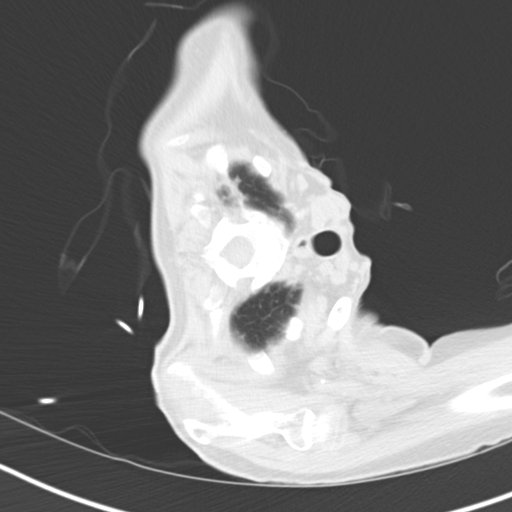
[im 31/34  soft-tissue]
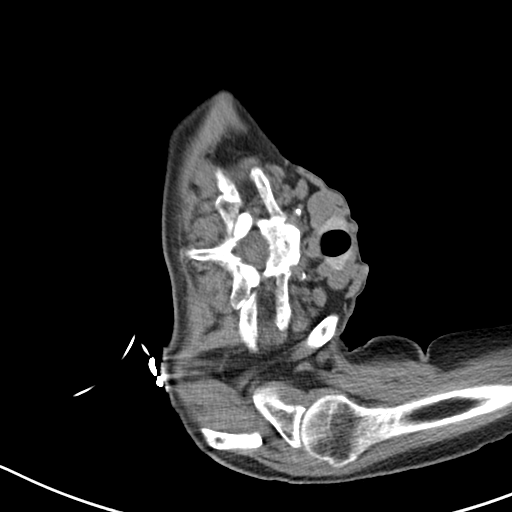
[im 31/34  lung]
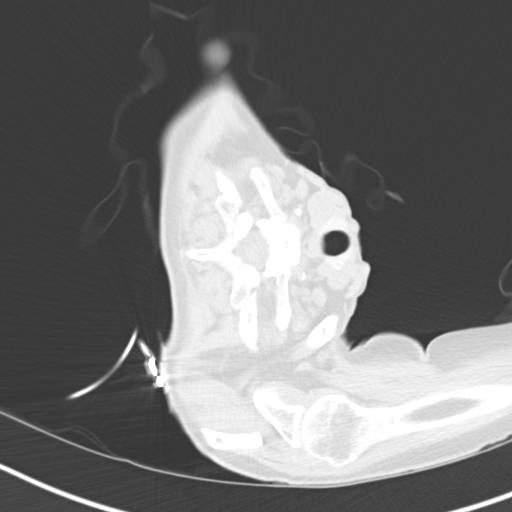

[12 of 32 positions shown; findings below may reference images not displayed]

EXAM:
CT GUIDED LEFT LUNG LESION BIOPSY

MEDICATIONS:
100 mcg fentanyl, 4 mg Versed. A radiology nurse monitored the
patient for moderate sedation.

ANESTHESIA/SEDATION:
Sedation time: 30 min

PROCEDURE:
The procedure was explained to the patient. The risks and benefits
of the procedure were discussed and the patient's questions were
addressed. Informed consent was obtained from the patient. Patient
was placed on her left side. CT images through the upper chest were
obtained. The lesion in the anterior left upper lobe was identified.
The left anterior chest was prepped and draped in sterile fashion.
Maximal barrier sterile technique was utilized including caps, mask,
sterile gowns, sterile gloves, sterile drape, hand hygiene and skin
antiseptic. Skin was anesthetized with 1% lidocaine. A 17 gauge
needle was directed into the anterior lesion. Needle was directed
lateral to the left side of the sternum. Needle position was
confirmed within the lesion with CT guidance. A total of 3 core
biopsies were obtained with an 18 gauge device. Specimens were
placed in formalin. 17 gauge needle was removed without
complication. Follow up CT images were obtained. A dressing was
placed over the puncture site.
FINDINGS: Irregular shaped lesion in the anterior left upper lobe. Needle
position confirmed within the lesion. No evidence for a pneumothorax
following the core biopsies.

COMPLICATIONS:
None
IMPRESSION: Successful CT-guided core biopsies of the left upper lobe lesion.

## 2014-09-19 DEATH — deceased
